# Patient Record
Sex: Female | Born: 1960
Health system: Southern US, Community
[De-identification: ages and names within clinical notes are randomized; demographics above are authoritative.]

## PROBLEM LIST (undated history)

## (undated) DIAGNOSIS — F410 Panic disorder [episodic paroxysmal anxiety] without agoraphobia: Secondary | ICD-10-CM

## (undated) DIAGNOSIS — N939 Abnormal uterine and vaginal bleeding, unspecified: Secondary | ICD-10-CM

## (undated) DIAGNOSIS — E7800 Pure hypercholesterolemia, unspecified: Secondary | ICD-10-CM

## (undated) DIAGNOSIS — N3941 Urge incontinence: Secondary | ICD-10-CM

## (undated) DIAGNOSIS — I82409 Acute embolism and thrombosis of unspecified deep veins of unspecified lower extremity: Secondary | ICD-10-CM

## (undated) DIAGNOSIS — E049 Nontoxic goiter, unspecified: Secondary | ICD-10-CM

## (undated) DIAGNOSIS — I10 Essential (primary) hypertension: Secondary | ICD-10-CM

## (undated) DIAGNOSIS — I499 Cardiac arrhythmia, unspecified: Secondary | ICD-10-CM

## (undated) HISTORY — DX: Abnormal uterine and vaginal bleeding, unspecified: N93.9

## (undated) HISTORY — PX: LEG SURGERY: SHX1003

## (undated) HISTORY — DX: Essential (primary) hypertension: I10

## (undated) HISTORY — DX: Urge incontinence: N39.41

## (undated) HISTORY — DX: Panic disorder (episodic paroxysmal anxiety): F41.0

## (undated) HISTORY — DX: Pure hypercholesterolemia, unspecified: E78.00

## (undated) HISTORY — DX: Nontoxic goiter, unspecified: E04.9

## (undated) HISTORY — PX: EYE SURGERY: SHX253

---

## 2009-07-16 ENCOUNTER — Emergency Department: Payer: Self-pay | Admitting: Emergency Medicine

## 2010-01-16 ENCOUNTER — Ambulatory Visit: Payer: Self-pay | Admitting: Family Medicine

## 2010-12-25 ENCOUNTER — Ambulatory Visit: Payer: Self-pay

## 2011-11-12 ENCOUNTER — Emergency Department: Payer: Self-pay | Admitting: Emergency Medicine

## 2012-01-09 ENCOUNTER — Ambulatory Visit: Payer: Self-pay

## 2012-01-20 ENCOUNTER — Ambulatory Visit: Payer: Self-pay

## 2013-02-08 ENCOUNTER — Ambulatory Visit: Payer: Self-pay | Admitting: Gastroenterology

## 2014-03-16 ENCOUNTER — Ambulatory Visit: Payer: Self-pay

## 2016-04-28 ENCOUNTER — Emergency Department (HOSPITAL_COMMUNITY): Payer: Commercial Managed Care - PPO

## 2016-04-28 ENCOUNTER — Encounter (HOSPITAL_COMMUNITY): Payer: Self-pay | Admitting: Nurse Practitioner

## 2016-04-28 ENCOUNTER — Emergency Department (HOSPITAL_COMMUNITY)
Admission: EM | Admit: 2016-04-28 | Discharge: 2016-04-28 | Disposition: A | Discharge status: Home / Self Care | Payer: Commercial Managed Care - PPO | Attending: Emergency Medicine | Admitting: Emergency Medicine

## 2016-04-28 DIAGNOSIS — W1842XA Slipping, tripping and stumbling without falling due to stepping into hole or opening, initial encounter: Secondary | ICD-10-CM | POA: Diagnosis not present

## 2016-04-28 DIAGNOSIS — Z791 Long term (current) use of non-steroidal anti-inflammatories (NSAID): Secondary | ICD-10-CM | POA: Insufficient documentation

## 2016-04-28 DIAGNOSIS — Y939 Activity, unspecified: Secondary | ICD-10-CM | POA: Diagnosis not present

## 2016-04-28 DIAGNOSIS — S99912A Unspecified injury of left ankle, initial encounter: Secondary | ICD-10-CM | POA: Diagnosis present

## 2016-04-28 DIAGNOSIS — Z79899 Other long term (current) drug therapy: Secondary | ICD-10-CM | POA: Diagnosis not present

## 2016-04-28 DIAGNOSIS — S92902A Unspecified fracture of left foot, initial encounter for closed fracture: Secondary | ICD-10-CM | POA: Diagnosis not present

## 2016-04-28 DIAGNOSIS — Y999 Unspecified external cause status: Secondary | ICD-10-CM | POA: Diagnosis not present

## 2016-04-28 DIAGNOSIS — Y929 Unspecified place or not applicable: Secondary | ICD-10-CM | POA: Diagnosis not present

## 2016-04-28 HISTORY — DX: Cardiac arrhythmia, unspecified: I49.9

## 2016-04-28 HISTORY — DX: Acute embolism and thrombosis of unspecified deep veins of unspecified lower extremity: I82.409

## 2016-04-28 MED ORDER — NAPROXEN 250 MG PO TABS
500.0000 mg | ORAL_TABLET | Freq: Once | ORAL | Status: AC
  Administered 2016-04-28: 500 mg via ORAL
  Filled 2016-04-28: qty 2

## 2016-04-28 MED ORDER — NAPROXEN 500 MG PO TABS: 500.0000 mg | ORAL_TABLET | Freq: Two times a day (BID) | ORAL | Status: DC

## 2016-04-28 MED ORDER — CYCLOBENZAPRINE HCL 10 MG PO TABS
5.0000 mg | ORAL_TABLET | Freq: Once | ORAL | Status: AC
  Administered 2016-04-28: 5 mg via ORAL
  Filled 2016-04-28: qty 1

## 2016-04-28 MED ORDER — CYCLOBENZAPRINE HCL 10 MG PO TABS: 10.0000 mg | ORAL_TABLET | Freq: Two times a day (BID) | ORAL | Status: DC | PRN

## 2016-04-28 NOTE — ED Provider Notes (Signed)
CSN: [id number]     Arrival date & time 04/28/16  1218 History   First MD Initiated Contact with Patient 04/28/16 1346     Chief Complaint  Patient presents with  . Ankle Injury   HPI  Brenda Guzman is a 55 y.o. female presenting with left ankle pain after stepping into a hole just prior to arrival. She states her ankle "twisted" and she felt a "pop." She describes her pain as constant, worse with weightbearing or movement, sharp, 6/10 pain scale, midfoot and lateral left ankle. She denies fevers, chills, chest pain, shortness of breath, abdominal pain, nausea, vomiting, change in bowel or bladder habits, injury elsewhere.  Past Medical History  Diagnosis Date  . Irregular heart beat   . DVT (deep venous thrombosis) [facility])    Past Surgical History  Procedure Laterality Date  . Cesarean section     History reviewed. No pertinent family history. Social History  Substance Use Topics  . Smoking status: Never Smoker   . Smokeless tobacco: None  . Alcohol Use: No   OB History    No data available     Review of Systems  Ten systems are reviewed and are negative for acute change except as noted in the HPI  Allergies  Review of patient's allergies indicates no known allergies.  Home Medications   Prior to Admission medications   Medication Sig Start Date End Date Taking? Authorizing Provider  cyclobenzaprine (FLEXERIL) 10 MG tablet Take 1 tablet (10 mg total) by mouth 2 (two) times daily as needed for muscle spasms. 04/28/16   [place] Lions, PA-C  naproxen (NAPROSYN) 500 MG tablet Take 1 tablet (500 mg total) by mouth 2 (two) times daily. 04/28/16   [place] Lions, PA-C   BP 128/95 mmHg  Pulse 63  Temp(Src) 97.8 F (36.6 C) (Oral)  Resp 16  SpO2 100% Physical Exam  Constitutional: She appears well-developed and well-nourished. No distress.  HENT:  Head: Normocephalic and atraumatic.  Eyes: Conjunctivae are normal. Right eye exhibits no discharge. Left eye exhibits  no discharge. No scleral icterus.  Neck: No tracheal deviation present.  Cardiovascular: Normal rate and intact distal pulses.   Pulmonary/Chest: Effort normal. No respiratory distress.  Abdominal: Soft. Bowel sounds are normal. She exhibits no distension and no mass. There is no tenderness. There is no rebound and no guarding.  Musculoskeletal: Normal range of motion. She exhibits edema and tenderness.  Tenderness at lateral malleolus and superior midfoot. Mild soft tissue edema at lateral foot. No discolorations.  Lymphadenopathy:    She has no cervical adenopathy.  Neurological: She is alert. Coordination normal.  Neurovascularly intact bilaterally  Skin: Skin is warm and dry. No rash noted. She is not diaphoretic. No erythema.  Psychiatric: She has a normal mood and affect. Her behavior is normal.  Nursing note and vitals reviewed.   ED Course  Procedures  Imaging Review Dg Ankle Complete Left  04/28/2016  CLINICAL DATA:  Stepped in hole twisting left ankle. EXAM: LEFT ANKLE COMPLETE - 3+ VIEW COMPARISON:  None. FINDINGS: There is a small fracture arising from the lateral aspect of the hindfoot. Presumably this is arising from the calcaneus. Mild overlying soft tissue swelling identified. The remaining osseous structures appear intact. IMPRESSION: 1. Small fracture arising from the lateral midfoot is presumably of calcaneal origin. Correlation with exact sided tenderness is advise. 2. Soft tissue swelling. Electronically Signed   By: Kerby Moors M.D.   On: 04/28/2016 13:27   I  have personally reviewed and evaluated these images and lab results as part of my medical decision-making.  MDM   Final diagnoses:  Ankle injury, left, initial encounter   X-ray demonstrates small fracture arising from lateral midfoot, presumably of calcaneal origin. Patient is not tender at calcaneus. Bone fragment most likely incidental finding. Will wrap patient's ankle today, give crutches, send home with  symptomatic treatment. Patient to follow-up with orthopedics. Patient is in agreement and understanding with the plan. Discussed case with Dr. Tyrone Nine who agrees with above plan.  [place] Lions, PA-C 04/28/16 [place], DO 04/28/16 1806

## 2016-04-28 NOTE — ED Notes (Signed)
She c/o L ankle pain onset since she walked into a hole just PTA. She twisted the ankle and felt a pop. Cms intaact

## 2016-04-28 NOTE — Discharge Instructions (Signed)
Brenda Guzman,  Nice meeting you! Please follow-up with Dr. Sharol Given (orthopedics). Return to the emergency department if you develop increased pain, discolorations, numbness/tingling, new/worsening symptoms. Feel better soon!  S. Wendie Simmer, PA-C

## 2016-08-08 ENCOUNTER — Other Ambulatory Visit: Payer: Self-pay | Admitting: Obstetrics and Gynecology

## 2016-08-08 DIAGNOSIS — Z1231 Encounter for screening mammogram for malignant neoplasm of breast: Secondary | ICD-10-CM

## 2016-08-28 ENCOUNTER — Ambulatory Visit
Admission: RE | Admit: 2016-08-28 | Discharge: 2016-08-28 | Disposition: A | Discharge status: Home / Self Care | Payer: Commercial Managed Care - PPO | Source: Ambulatory Visit | Attending: Obstetrics and Gynecology | Admitting: Obstetrics and Gynecology

## 2016-08-28 DIAGNOSIS — Z1231 Encounter for screening mammogram for malignant neoplasm of breast: Secondary | ICD-10-CM | POA: Diagnosis present

## 2016-09-04 ENCOUNTER — Inpatient Hospital Stay
Admission: RE | Admit: 2016-09-04 | Discharge: 2016-09-04 | Disposition: A | Discharge status: Home / Self Care | Payer: Self-pay | Source: Ambulatory Visit | Attending: *Deleted | Admitting: *Deleted

## 2016-09-04 ENCOUNTER — Other Ambulatory Visit: Payer: Self-pay | Admitting: *Deleted

## 2016-09-04 DIAGNOSIS — Z9289 Personal history of other medical treatment: Secondary | ICD-10-CM

## 2017-05-13 DIAGNOSIS — H25013 Cortical age-related cataract, bilateral: Secondary | ICD-10-CM | POA: Diagnosis not present

## 2017-05-13 DIAGNOSIS — H1013 Acute atopic conjunctivitis, bilateral: Secondary | ICD-10-CM | POA: Diagnosis not present

## 2017-05-13 DIAGNOSIS — H40013 Open angle with borderline findings, low risk, bilateral: Secondary | ICD-10-CM | POA: Diagnosis not present

## 2017-05-13 DIAGNOSIS — H524 Presbyopia: Secondary | ICD-10-CM | POA: Diagnosis not present

## 2017-05-20 DIAGNOSIS — D2262 Melanocytic nevi of left upper limb, including shoulder: Secondary | ICD-10-CM | POA: Diagnosis not present

## 2017-05-20 DIAGNOSIS — D225 Melanocytic nevi of trunk: Secondary | ICD-10-CM | POA: Diagnosis not present

## 2017-05-20 DIAGNOSIS — D2272 Melanocytic nevi of left lower limb, including hip: Secondary | ICD-10-CM | POA: Diagnosis not present

## 2017-07-23 ENCOUNTER — Ambulatory Visit (INDEPENDENT_AMBULATORY_CARE_PROVIDER_SITE_OTHER): Payer: Commercial Managed Care - PPO | Admitting: Obstetrics and Gynecology

## 2017-07-23 ENCOUNTER — Encounter: Payer: Self-pay | Admitting: Obstetrics and Gynecology

## 2017-07-23 VITALS — BP 122/74 | Ht 62.0 in | Wt 131.0 lb

## 2017-07-23 DIAGNOSIS — Z1231 Encounter for screening mammogram for malignant neoplasm of breast: Secondary | ICD-10-CM

## 2017-07-23 DIAGNOSIS — Z803 Family history of malignant neoplasm of breast: Secondary | ICD-10-CM | POA: Diagnosis not present

## 2017-07-23 DIAGNOSIS — Z124 Encounter for screening for malignant neoplasm of cervix: Secondary | ICD-10-CM

## 2017-07-23 DIAGNOSIS — Z1151 Encounter for screening for human papillomavirus (HPV): Secondary | ICD-10-CM

## 2017-07-23 DIAGNOSIS — E049 Nontoxic goiter, unspecified: Secondary | ICD-10-CM | POA: Diagnosis not present

## 2017-07-23 DIAGNOSIS — Z01419 Encounter for gynecological examination (general) (routine) without abnormal findings: Secondary | ICD-10-CM | POA: Diagnosis not present

## 2017-07-23 DIAGNOSIS — Z1239 Encounter for other screening for malignant neoplasm of breast: Secondary | ICD-10-CM

## 2017-07-23 NOTE — Progress Notes (Signed)
PCP: [place] Pink, MD   Chief Complaint  Patient presents with  . Annual Exam    HPI:      Brenda Guzman is a 56 y.o. No obstetric history on file. who LMP was No LMP recorded. Patient is not currently having periods (Reason: Perimenopausal)., presents today for her annual examination.  Her menses are absent due to menopause. She does not have intermenstrual bleeding. She does not have vasomotor sx.   Sex activity: single partner, contraception - post menopausal status. She does have vaginal dryness, improved with lubricants.  Last Pap: March 07, 2014  Results were: no abnormalities /neg HPV DNA.  Hx of STDs: none  Last mammogram: August 28, 2016  Results were: normal--routine follow-up in 12 months There is a FH of breast cancer in her mat aunt and pat cousin, genetic testing not indicated. There is no FH of ovarian cancer. The patient does do self-breast exams.  Colonoscopy: colonoscopy 4 years ago without abnormalities. . Repeat due after 5 years due to [place].  Tobacco use: The patient denies current or previous tobacco use. Alcohol use: none Exercise: moderately active  She does not get adequate calcium and Vitamin D in her diet.  Since her last annual GYN exam, she has not had any significant changes in her health history. Pt with hx of enlarged thyroid with normal thyroid labs the past 3 yrs and stable u/s (2015). She is due for labs this year.   Labs with PCP.    Past Medical History:  Diagnosis Date  . Abnormal uterine bleeding   . DVT (deep venous thrombosis) ([place])   . Enlarged thyroid   . Hypercholesterolemia   . Hypertension   . Irregular heart beat   . Panic disorder   . Urge incontinence     Past Surgical History:  Procedure Laterality Date  . CESAREAN SECTION    . EYE SURGERY    . LEG SURGERY      Family History  Problem Relation Age of Onset  . Breast cancer Maternal Aunt 70  . Breast cancer Cousin 46  . Alzheimer's disease Father   .  Hypertension Father   . Pancreatic cancer Maternal Grandmother   . Colon cancer Paternal Grandmother     Social History   Social History  . Marital status: Married    Spouse name: N/A  . Number of children: N/A  . Years of education: N/A   Occupational History  . Not on file.   Social History Main Topics  . Smoking status: Never Smoker  . Smokeless tobacco: Never Used  . Alcohol use No  . Drug use: No  . Sexual activity: Yes   Other Topics Concern  . Not on file   Social History Narrative  . No narrative on file    No outpatient prescriptions have been marked as taking for the 07/23/17 encounter (Office Visit) with Copland, Deirdre Evener, PA-C.      ROS:  Review of Systems  Constitutional: Negative for fatigue, fever and unexpected weight change.  Respiratory: Negative for cough, shortness of breath and wheezing.   Cardiovascular: Negative for chest pain, palpitations and leg swelling.  Gastrointestinal: Negative for blood in stool, constipation, diarrhea, nausea and vomiting.  Endocrine: Negative for cold intolerance, heat intolerance and polyuria.  Genitourinary: Negative for dyspareunia, dysuria, flank pain, frequency, genital sores, hematuria, menstrual problem, pelvic pain, urgency, vaginal bleeding, vaginal discharge and vaginal pain.  Musculoskeletal: Negative for back pain, joint swelling and myalgias.  Skin: Negative for rash.  Neurological: Negative for dizziness, syncope, light-headedness, numbness and headaches.  Hematological: Negative for adenopathy.  Psychiatric/Behavioral: Negative for agitation, confusion, sleep disturbance and suicidal ideas. The patient is not nervous/anxious.      Objective: BP 122/74   Ht 5\' 2"  (1.575 m)   Wt 131 lb (59.4 kg)   BMI 23.96 kg/m    Physical Exam  Constitutional: She is oriented to person, place, and time. She appears well-developed and well-nourished.  Genitourinary: Vagina normal and uterus normal. There is  no rash or tenderness on the right labia. There is no rash or tenderness on the left labia. No erythema or tenderness in the vagina. No vaginal discharge found. Right adnexum does not display mass and does not display tenderness. Left adnexum does not display mass and does not display tenderness. Cervix does not exhibit motion tenderness or polyp. Uterus is not enlarged or tender.  Neck: Normal range of motion. Thyromegaly present.  Cardiovascular: Normal rate, regular rhythm and normal heart sounds.   No murmur heard. Pulmonary/Chest: Effort normal and breath sounds normal. Right breast exhibits no mass, no nipple discharge, no skin change and no tenderness. Left breast exhibits no mass, no nipple discharge, no skin change and no tenderness.  Abdominal: Soft. There is no tenderness. There is no guarding.  Musculoskeletal: Normal range of motion.  Neurological: She is alert and oriented to person, place, and time. No cranial nerve deficit.  Psychiatric: She has a normal mood and affect. Her behavior is normal.  Vitals reviewed.    Assessment/Plan:  Encounter for annual routine gynecological examination  Cervical cancer screening - Plan: IGP, Aptima HPV  Screening for HPV (human papillomavirus) - Plan: IGP, Aptima HPV  Screening for breast cancer - Pt to sched mammo. - Plan: MM SCREENING BREAST TOMO BILATERAL  Family history of breast cancer - Plan: MM SCREENING BREAST TOMO BILATERAL  Enlarged thyroid - Check labs. If abn, will check u/s.  - Plan: TSH + free T4          GYN counsel mammography screening, adequate intake of calcium and vitamin D    F/U  Return in about 1 year (around 07/23/2018).  Alicia B. Copland, PA-C 07/23/2017 8:56 AM

## 2017-07-24 LAB — TSH+FREE T4
FREE T4: 1.19 ng/dL (ref 0.82–1.77)
TSH: 2.2 u[IU]/mL (ref 0.450–4.500)

## 2017-07-26 LAB — IGP, APTIMA HPV
HPV APTIMA: NEGATIVE
PAP Smear Comment: 0

## 2017-09-03 DIAGNOSIS — Z13220 Encounter for screening for lipoid disorders: Secondary | ICD-10-CM | POA: Diagnosis not present

## 2017-09-03 DIAGNOSIS — Z131 Encounter for screening for diabetes mellitus: Secondary | ICD-10-CM | POA: Diagnosis not present

## 2017-09-03 DIAGNOSIS — Z79899 Other long term (current) drug therapy: Secondary | ICD-10-CM | POA: Diagnosis not present

## 2017-09-10 DIAGNOSIS — Z0000 Encounter for general adult medical examination without abnormal findings: Secondary | ICD-10-CM | POA: Diagnosis not present

## 2017-09-10 DIAGNOSIS — Z23 Encounter for immunization: Secondary | ICD-10-CM | POA: Diagnosis not present

## 2017-09-10 DIAGNOSIS — E782 Mixed hyperlipidemia: Secondary | ICD-10-CM | POA: Diagnosis not present

## 2017-09-10 DIAGNOSIS — R002 Palpitations: Secondary | ICD-10-CM | POA: Diagnosis not present

## 2017-10-13 DIAGNOSIS — R002 Palpitations: Secondary | ICD-10-CM | POA: Diagnosis not present

## 2017-10-13 DIAGNOSIS — E782 Mixed hyperlipidemia: Secondary | ICD-10-CM | POA: Diagnosis not present

## 2017-10-13 DIAGNOSIS — Z8679 Personal history of other diseases of the circulatory system: Secondary | ICD-10-CM | POA: Diagnosis not present

## 2017-10-15 IMAGING — MG MM DIGITAL SCREENING BILAT W/ TOMO W/ CAD
9 of 12 series · 9 of 28 positions shown · non-contrast
Comparison: Previous exam(s).

CLINICAL DATA: Screening.

EXAM:
2D DIGITAL SCREENING BILATERAL MAMMOGRAM WITH CAD AND ADJUNCT TOMO

[R MLO]
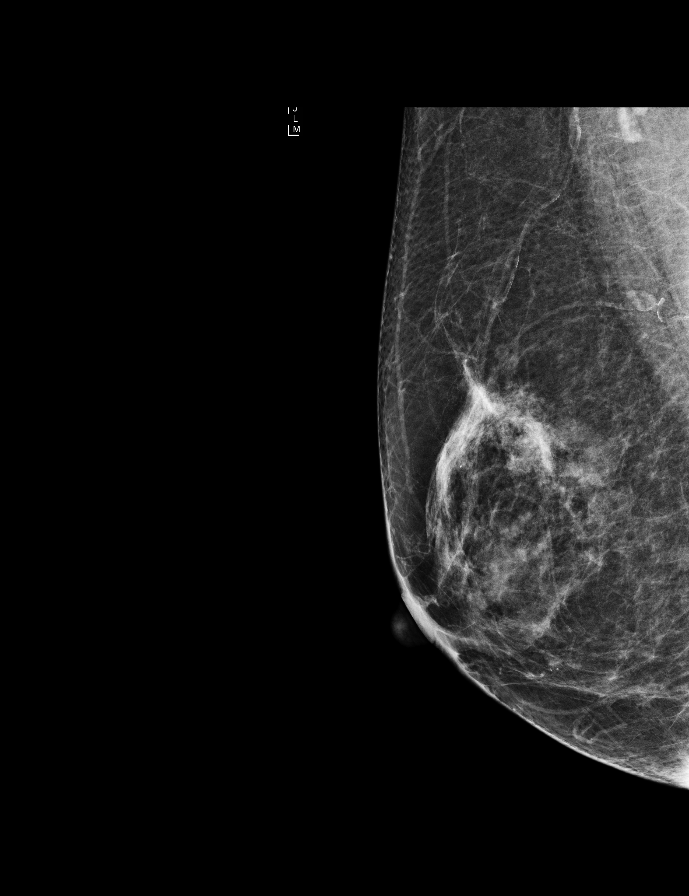

[R CC synth-2D]
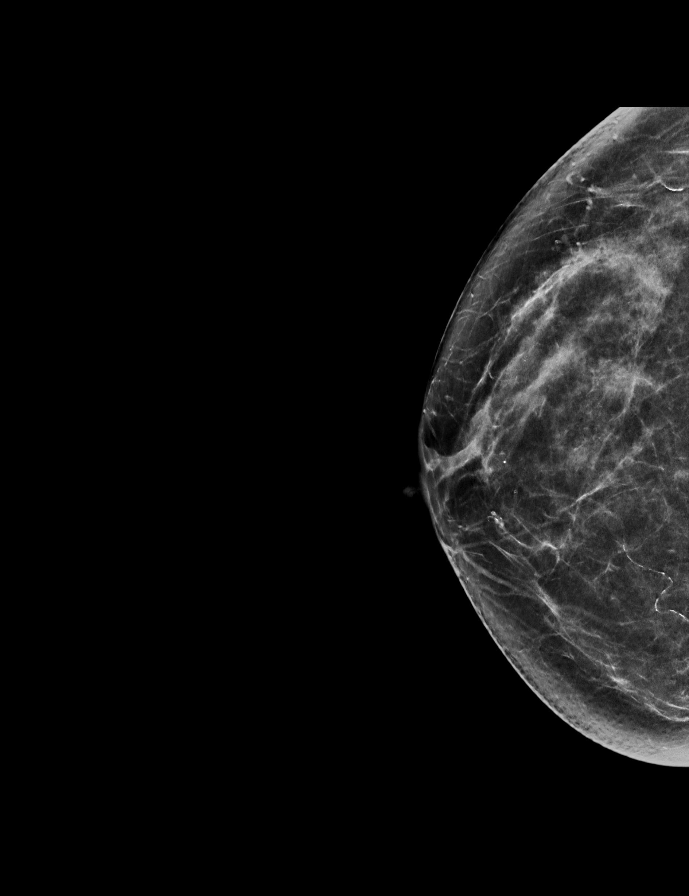

[R MLO synth-2D]
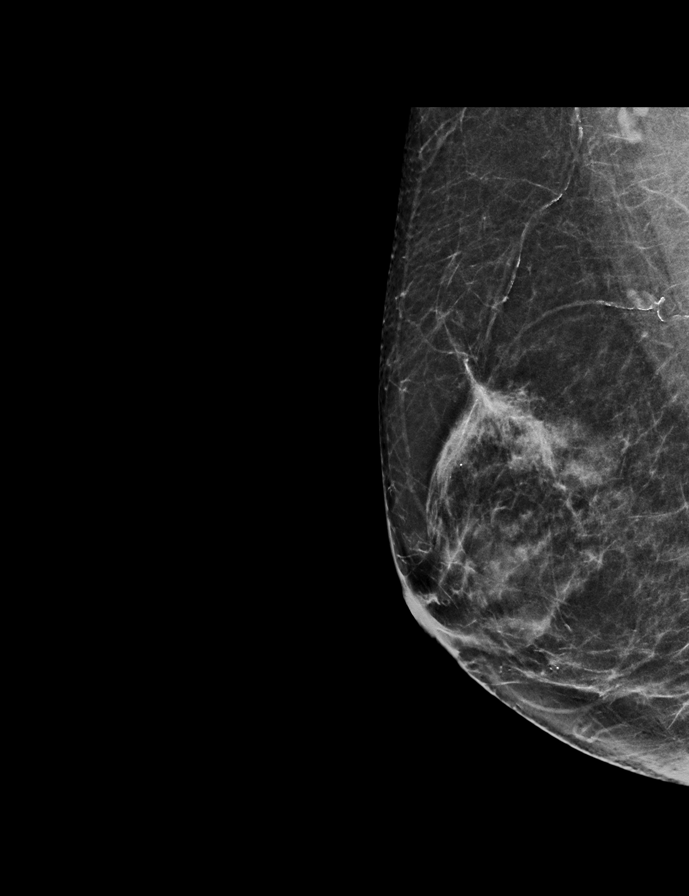

[L MLO synth-2D]
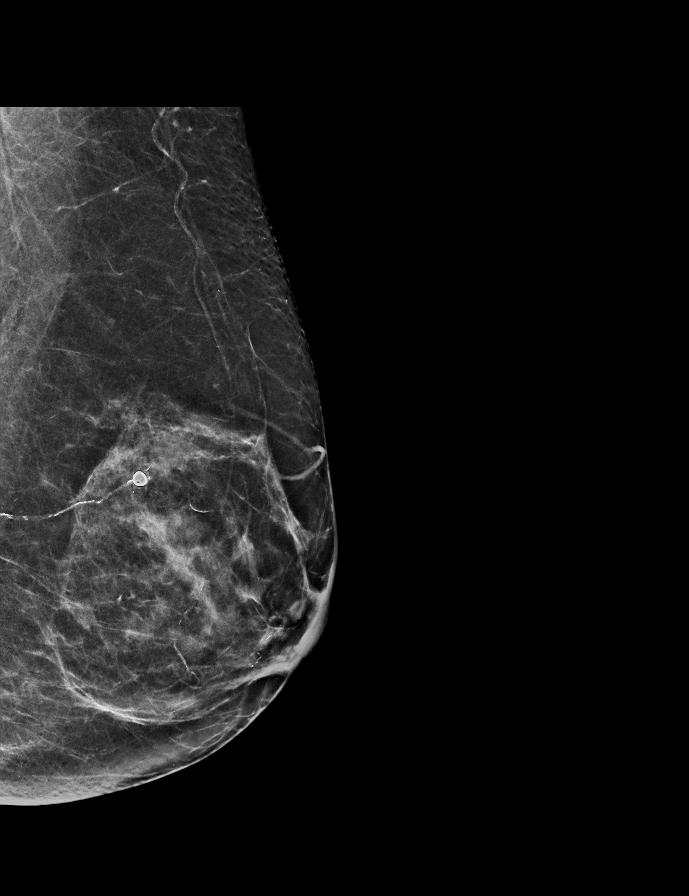

[L MLO]
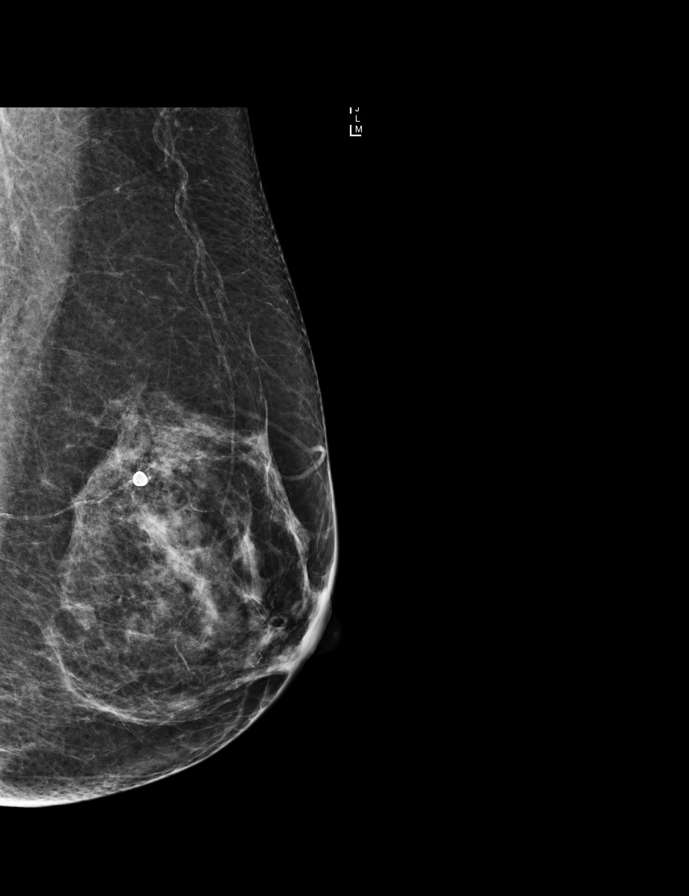

[R CC]
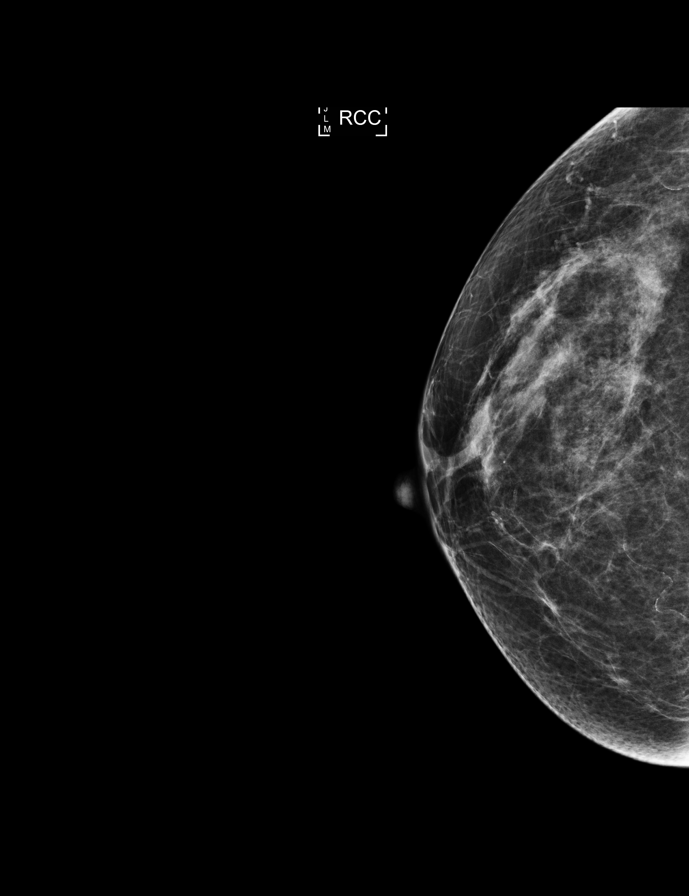

[L CC synth-2D]
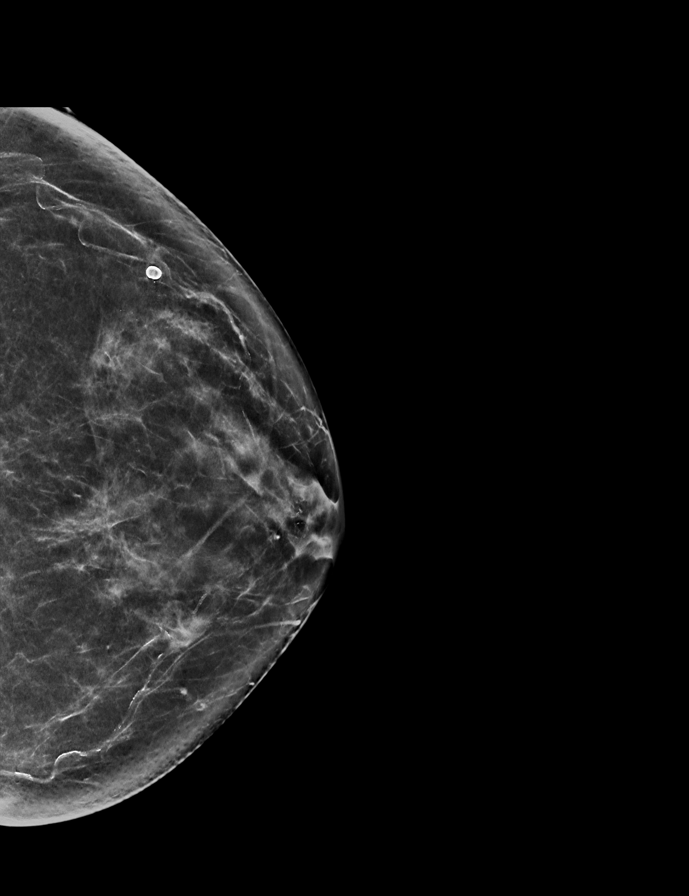

[L CC]
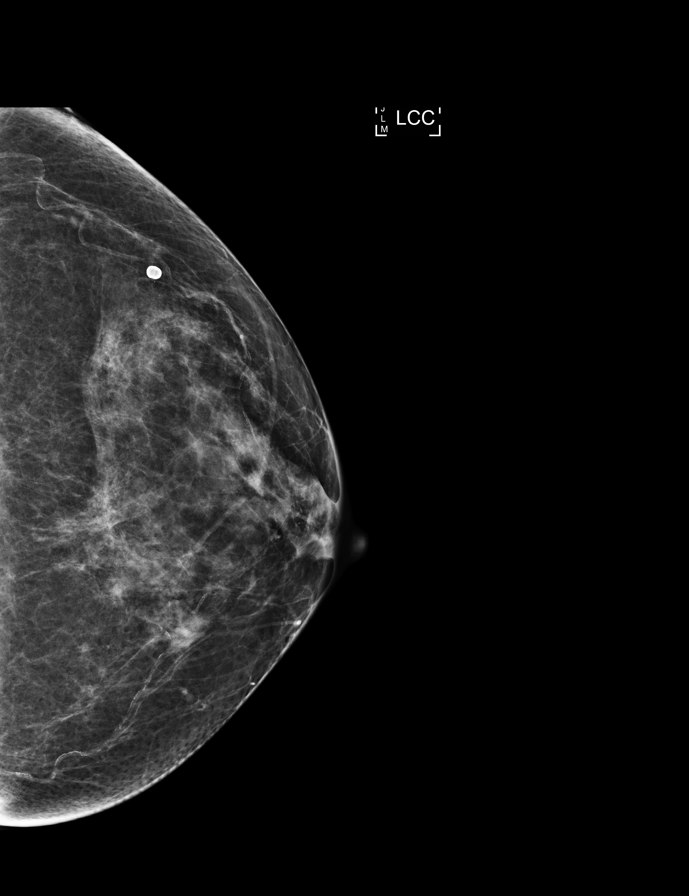

[R CC tomo · tomo slice 35/69.0]
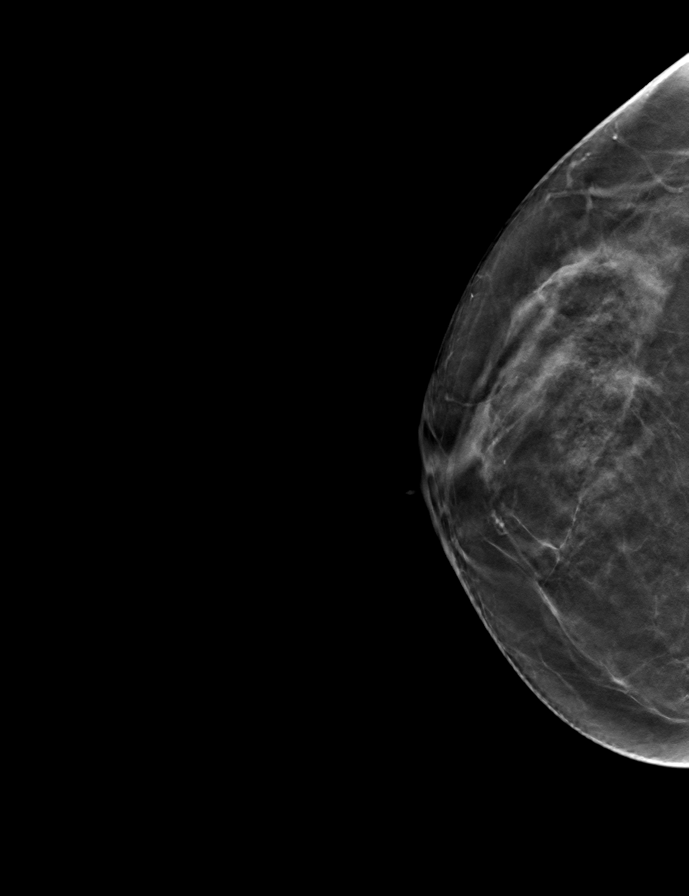

[9 of 28 positions shown; findings below may reference images not displayed]

ACR Breast Density Category b: There are scattered areas of
fibroglandular density.
FINDINGS: There are no findings suspicious for malignancy. Images were
processed with CAD.
IMPRESSION: No mammographic evidence of malignancy. A result letter of this
screening mammogram will be mailed directly to the patient.

RECOMMENDATION:
Screening mammogram in one year. (Code:97-6-RS4)

BI-RADS CATEGORY  1: Negative.

## 2017-11-10 DIAGNOSIS — J0100 Acute maxillary sinusitis, unspecified: Secondary | ICD-10-CM | POA: Diagnosis not present

## 2017-11-17 ENCOUNTER — Encounter (HOSPITAL_COMMUNITY): Payer: Self-pay

## 2017-11-17 ENCOUNTER — Emergency Department (HOSPITAL_COMMUNITY)
Admission: EM | Admit: 2017-11-17 | Discharge: 2017-11-17 | Disposition: A | Discharge status: Home / Self Care | Payer: Commercial Managed Care - PPO | Attending: Emergency Medicine | Admitting: Emergency Medicine

## 2017-11-17 ENCOUNTER — Other Ambulatory Visit: Payer: Self-pay

## 2017-11-17 DIAGNOSIS — R55 Syncope and collapse: Secondary | ICD-10-CM | POA: Diagnosis not present

## 2017-11-17 DIAGNOSIS — I10 Essential (primary) hypertension: Secondary | ICD-10-CM | POA: Insufficient documentation

## 2017-11-17 DIAGNOSIS — R519 Headache, unspecified: Secondary | ICD-10-CM

## 2017-11-17 DIAGNOSIS — E860 Dehydration: Secondary | ICD-10-CM

## 2017-11-17 DIAGNOSIS — Z79899 Other long term (current) drug therapy: Secondary | ICD-10-CM | POA: Insufficient documentation

## 2017-11-17 DIAGNOSIS — Z7982 Long term (current) use of aspirin: Secondary | ICD-10-CM | POA: Diagnosis not present

## 2017-11-17 DIAGNOSIS — R197 Diarrhea, unspecified: Secondary | ICD-10-CM | POA: Diagnosis not present

## 2017-11-17 DIAGNOSIS — R001 Bradycardia, unspecified: Secondary | ICD-10-CM | POA: Diagnosis not present

## 2017-11-17 DIAGNOSIS — R51 Headache: Secondary | ICD-10-CM | POA: Diagnosis not present

## 2017-11-17 DIAGNOSIS — I499 Cardiac arrhythmia, unspecified: Secondary | ICD-10-CM | POA: Diagnosis not present

## 2017-11-17 LAB — CBC WITH DIFFERENTIAL/PLATELET
Basophils Absolute: 0 10*3/uL (ref 0.0–0.1)
Basophils Relative: 0 %
EOS PCT: 0 %
Eosinophils Absolute: 0 10*3/uL (ref 0.0–0.7)
HEMATOCRIT: 41.3 % (ref 36.0–46.0)
Hemoglobin: 14.1 g/dL (ref 12.0–15.0)
LYMPHS PCT: 10 %
Lymphs Abs: 1.6 10*3/uL (ref 0.7–4.0)
MCH: 30.5 pg (ref 26.0–34.0)
MCHC: 34.1 g/dL (ref 30.0–36.0)
MCV: 89.4 fL (ref 78.0–100.0)
MONO ABS: 0.9 10*3/uL (ref 0.1–1.0)
Monocytes Relative: 6 %
NEUTROS ABS: 13.5 10*3/uL — AB (ref 1.7–7.7)
Neutrophils Relative %: 84 %
PLATELETS: 289 10*3/uL (ref 150–400)
RBC: 4.62 MIL/uL (ref 3.87–5.11)
RDW: 12.7 % (ref 11.5–15.5)
WBC: 16 10*3/uL — ABNORMAL HIGH (ref 4.0–10.5)

## 2017-11-17 LAB — COMPREHENSIVE METABOLIC PANEL
ALK PHOS: 102 U/L (ref 38–126)
ALT: 38 U/L (ref 14–54)
ANION GAP: 9 (ref 5–15)
AST: 34 U/L (ref 15–41)
Albumin: 4.3 g/dL (ref 3.5–5.0)
BILIRUBIN TOTAL: 0.9 mg/dL (ref 0.3–1.2)
BUN: 22 mg/dL — AB (ref 6–20)
CALCIUM: 9.5 mg/dL (ref 8.9–10.3)
CO2: 25 mmol/L (ref 22–32)
Chloride: 102 mmol/L (ref 101–111)
Creatinine, Ser: 0.78 mg/dL (ref 0.44–1.00)
GFR calc Af Amer: >60 mL/min (ref >60)
GFR calc non Af Amer: >60 mL/min (ref >60)
GLUCOSE: 115 mg/dL — AB (ref 65–99)
POTASSIUM: 3.7 mmol/L (ref 3.5–5.1)
SODIUM: 136 mmol/L (ref 135–145)
Total Protein: 7.9 g/dL (ref 6.5–8.1)

## 2017-11-17 LAB — CBG MONITORING, ED: Glucose-Capillary: 102 mg/dL — ABNORMAL HIGH (ref 65–99)

## 2017-11-17 MED ORDER — KETOROLAC TROMETHAMINE 15 MG/ML IJ SOLN
15.0000 mg | Freq: Once | INTRAMUSCULAR | Status: AC
  Administered 2017-11-17: 15 mg via INTRAVENOUS
  Filled 2017-11-17: qty 1

## 2017-11-17 MED ORDER — SODIUM CHLORIDE 0.9 % IV BOLUS (SEPSIS)
1000.0000 mL | Freq: Once | INTRAVENOUS | Status: AC
  Administered 2017-11-17: 1000 mL via INTRAVENOUS

## 2017-11-17 MED ORDER — METOCLOPRAMIDE HCL 5 MG/ML IJ SOLN
10.0000 mg | Freq: Once | INTRAMUSCULAR | Status: AC
  Administered 2017-11-17: 10 mg via INTRAVENOUS
  Filled 2017-11-17: qty 2

## 2017-11-17 MED ORDER — DIPHENHYDRAMINE HCL 50 MG/ML IJ SOLN
12.5000 mg | Freq: Once | INTRAMUSCULAR | Status: AC
Start: 2017-11-17 — End: 2017-11-17
  Administered 2017-11-17: 12.5 mg via INTRAVENOUS
  Filled 2017-11-17: qty 1

## 2017-11-17 NOTE — ED Provider Notes (Signed)
[facility] DEPT Provider Note   CSN: [id number] Arrival date & time: 11/17/17  8786     History   Chief Complaint Chief Complaint  Patient presents with  . Diarrhea    HPI Brenda Guzman is a 56 y.o. female.  The history is provided by the patient. No language interpreter was used.  Diarrhea      Brenda Guzman is a 56 y.o. female who presents to the Emergency Department complaining of HA, diarrhea.  She reports several weeks ago feeling like she had a sinus infection with nasal congestion and facial pressure.  She was called in a Z-Pak and took that but had ongoing symptoms.  She then saw her primary care provider and was started on prednisone for a sinus infection.  She reports bitemporal headache that is been present for the last 10 days.  It is constant and moderate in nature.  Yesterday she started taking Sudafed and did have mild improvement in her headache but it recurred today.  No objective fevers.  No nausea, vomiting.  This morning she developed diarrhea with 3 pale colored stools.  During the episode she felt hot and not good as if she might pass out.  Overall currently she complains of headache and feeling unwell and profoundly weak in general.  Past Medical History:  Diagnosis Date  . Abnormal uterine bleeding   . DVT (deep venous thrombosis) ([place])   . Enlarged thyroid   . Hypercholesterolemia   . Hypertension   . Irregular heart beat   . Panic disorder   . Urge incontinence     Patient Active Problem List   Diagnosis Date Noted  . Enlarged thyroid 07/23/2017    Past Surgical History:  Procedure Laterality Date  . CESAREAN SECTION    . EYE SURGERY    . LEG SURGERY      OB History    Gravida Para Term Preterm AB Living   3 1 1   2 1    SAB TAB Ectopic Multiple Live Births   2       1       Home Medications    Prior to Admission medications   Medication Sig Start Date End Date Taking? Authorizing Provider    acetaminophen (TYLENOL) 500 MG tablet Take 1,000 mg by mouth every 6 (six) hours as needed for mild pain.   Yes [provider]  ALPRAZolam (XANAX) 1 MG tablet TAKE 1/2 TO 1 TABLET BY MOUTH NIGHTLY AS NEEDED FOR SLEEP 08/19/16  Yes [provider]  aspirin EC 81 MG tablet Take 81 mg by mouth daily.   Yes [provider]  atorvastatin (LIPITOR) 20 MG tablet Take 10 mg by mouth daily. 09/10/17  Yes [provider]  azelastine (ASTELIN) 0.1 % nasal spray PLACE 1 SPRAY INTO BOTH NOSTRILS 2 (TWO) TIMES DAILY 10/21/17  Yes [provider]  cetirizine (ZYRTEC) 10 MG tablet Take 10 mg by mouth daily. 11/10/17  Yes [provider]  metoprolol succinate (TOPROL-XL) 25 MG 24 hr tablet Take 25 mg by mouth daily. 09/08/17  Yes [provider]  predniSONE (DELTASONE) 10 MG tablet Take 10 mg by mouth daily. 11/10/17  Yes [provider]  pseudoephedrine (SUDAFED) 60 MG tablet Take 60 mg by mouth every 4 (four) hours as needed for congestion.   Yes [provider]  cyclobenzaprine (FLEXERIL) 10 MG tablet Take 1 tablet (10 mg total) by mouth 2 (two) times daily as needed for  muscle spasms. Patient not taking: Reported on 11/17/2017 04/28/16   [place] Lions, PA-C  naproxen (NAPROSYN) 500 MG tablet Take 1 tablet (500 mg total) by mouth 2 (two) times daily. Patient not taking: Reported on 11/17/2017 04/28/16   [place] Lions, PA-C    Family History Family History  Problem Relation Age of Onset  . Breast cancer Maternal Aunt 70  . Breast cancer Cousin 55  . Alzheimer's disease Father   . Hypertension Father   . Pancreatic cancer Maternal Grandmother   . Colon cancer Paternal Grandmother     Social History Social History   Tobacco Use  . Smoking status: Never Smoker  . Smokeless tobacco: Never Used  Substance Use Topics  . Alcohol use: No  . Drug use: No     Allergies   Patient has no known  allergies.   Review of Systems Review of Systems  Gastrointestinal: Positive for diarrhea.  All other systems reviewed and are negative.    Physical Exam Updated Vital Signs BP 112/76 (BP Location: Right Arm)   Pulse 62   Temp 97.6 F (36.4 C) (Oral)   Resp 14   SpO2 100%   Physical Exam  Constitutional: She is oriented to person, place, and time. She appears well-developed and well-nourished.  HENT:  Head: Normocephalic and atraumatic.  Right Ear: External ear normal.  Left Ear: External ear normal.  Eyes: Conjunctivae and EOM are normal. Pupils are equal, round, and reactive to light.  Cardiovascular: Normal rate and regular rhythm.  No murmur heard. Pulmonary/Chest: Effort normal and breath sounds normal. No respiratory distress.  Abdominal: Soft. There is no tenderness. There is no rebound and no guarding.  Musculoskeletal: She exhibits no edema or tenderness.  Neurological: She is alert and oriented to person, place, and time. No cranial nerve deficit.  5 out of 5 strength in all 4 extremities with sensation to light touch intact in all 4 extremities  Skin: Skin is warm and dry.  Psychiatric: She has a normal mood and affect. Her behavior is normal.  Nursing note and vitals reviewed.    ED Treatments / Results  Labs (all labs ordered are listed, but only abnormal results are displayed) Labs Reviewed  COMPREHENSIVE METABOLIC PANEL - Abnormal; Notable for the following components:      Result Value   Glucose, Bld 115 (*)    BUN 22 (*)    All other components within normal limits  CBC WITH DIFFERENTIAL/PLATELET - Abnormal; Notable for the following components:   WBC 16.0 (*)    Neutro Abs 13.5 (*)    All other components within normal limits  CBG MONITORING, ED - Abnormal; Notable for the following components:   Glucose-Capillary 102 (*)    All other components within normal limits    EKG  EKG Interpretation  Date/Time:  Monday November 17 2017 09:00:18  EST Ventricular Rate:  57 PR Interval:    QRS Duration: 95 QT Interval:  424 QTC Calculation: 413 R Axis:   73 Text Interpretation:  Sinus rhythm Confirmed by Quintella Reichert [phone]) on 11/17/2017 11:34:06 AM       Radiology No results found.  Procedures Procedures (including critical care time)  Medications Ordered in ED Medications  sodium chloride 0.9 % bolus 1,000 mL (0 mLs Intravenous Stopped 11/17/17 1204)  metoCLOPramide (REGLAN) injection 10 mg (10 mg Intravenous Given 11/17/17 1043)  diphenhydrAMINE (BENADRYL) injection 12.5 mg (12.5 mg Intravenous Given 11/17/17 1041)  ketorolac (TORADOL) 15 MG/ML injection  15 mg (15 mg Intravenous Given 11/17/17 1247)     Initial Impression / Assessment and Plan / ED Course  I have reviewed the triage vital signs and the nursing notes.  Pertinent labs & imaging results that were available during my care of the patient were reviewed by me and considered in my medical decision making (see chart for details).     Sheet here for evaluation of headache, diarrhea with near syncopal event today.  Patient is unable to provide stool sample in the emergency department.  In terms of near syncope, this is likely secondary to dehydration and vasovagal episode.  In terms of headache, presentation is not consistent with serious bacterial infection, meningitis, subarachnoid hemorrhage, dural sinus thrombosis.  CBC does have leukocytosis, this is likely secondary to prednisone use.  She is feeling improved on repeat evaluation.  Counseled patient on home care, outpatient follow-up and return precautions.  Final Clinical Impressions(s) / ED Diagnoses   Final diagnoses:  Diarrhea, unspecified type  Dehydration  Near syncope  Bad headache    ED Discharge Orders    None       Quintella Reichert, MD 11/17/17 [phone]

## 2017-11-17 NOTE — ED Triage Notes (Signed)
She states that after having had a diarrhea wtool this morning she felt "hot and not good". This feeling passed quickly and she is in no distress. She mentions that she has just completed antibiotic therapy for a "sinus infection".

## 2017-12-10 DIAGNOSIS — R0981 Nasal congestion: Secondary | ICD-10-CM | POA: Diagnosis not present

## 2017-12-10 DIAGNOSIS — H6983 Other specified disorders of Eustachian tube, bilateral: Secondary | ICD-10-CM | POA: Diagnosis not present

## 2017-12-10 DIAGNOSIS — R51 Headache: Secondary | ICD-10-CM | POA: Diagnosis not present

## 2017-12-11 DIAGNOSIS — H903 Sensorineural hearing loss, bilateral: Secondary | ICD-10-CM | POA: Diagnosis not present

## 2017-12-11 DIAGNOSIS — J019 Acute sinusitis, unspecified: Secondary | ICD-10-CM | POA: Diagnosis not present

## 2018-05-14 DIAGNOSIS — H25013 Cortical age-related cataract, bilateral: Secondary | ICD-10-CM | POA: Diagnosis not present

## 2018-05-14 DIAGNOSIS — H1013 Acute atopic conjunctivitis, bilateral: Secondary | ICD-10-CM | POA: Diagnosis not present

## 2018-05-14 DIAGNOSIS — H40013 Open angle with borderline findings, low risk, bilateral: Secondary | ICD-10-CM | POA: Diagnosis not present

## 2018-05-14 DIAGNOSIS — H524 Presbyopia: Secondary | ICD-10-CM | POA: Diagnosis not present

## 2018-05-19 ENCOUNTER — Other Ambulatory Visit: Payer: Self-pay | Admitting: Obstetrics and Gynecology

## 2018-05-19 DIAGNOSIS — D2262 Melanocytic nevi of left upper limb, including shoulder: Secondary | ICD-10-CM | POA: Diagnosis not present

## 2018-05-19 DIAGNOSIS — D225 Melanocytic nevi of trunk: Secondary | ICD-10-CM | POA: Diagnosis not present

## 2018-05-19 DIAGNOSIS — Z1231 Encounter for screening mammogram for malignant neoplasm of breast: Secondary | ICD-10-CM

## 2018-05-19 DIAGNOSIS — D2261 Melanocytic nevi of right upper limb, including shoulder: Secondary | ICD-10-CM | POA: Diagnosis not present

## 2018-06-16 ENCOUNTER — Ambulatory Visit
Admission: RE | Admit: 2018-06-16 | Discharge: 2018-06-16 | Disposition: A | Discharge status: Home / Self Care | Payer: Commercial Managed Care - PPO | Source: Ambulatory Visit | Attending: Obstetrics and Gynecology | Admitting: Obstetrics and Gynecology

## 2018-06-16 DIAGNOSIS — Z1231 Encounter for screening mammogram for malignant neoplasm of breast: Secondary | ICD-10-CM | POA: Insufficient documentation

## 2018-06-17 ENCOUNTER — Encounter: Payer: Self-pay | Admitting: Obstetrics and Gynecology

## 2018-10-12 DIAGNOSIS — R002 Palpitations: Secondary | ICD-10-CM | POA: Diagnosis not present

## 2018-10-12 DIAGNOSIS — Z8679 Personal history of other diseases of the circulatory system: Secondary | ICD-10-CM | POA: Diagnosis not present

## 2018-10-12 DIAGNOSIS — E782 Mixed hyperlipidemia: Secondary | ICD-10-CM | POA: Diagnosis not present

## 2018-10-28 DIAGNOSIS — R002 Palpitations: Secondary | ICD-10-CM | POA: Diagnosis not present

## 2018-10-30 DIAGNOSIS — R002 Palpitations: Secondary | ICD-10-CM | POA: Diagnosis not present

## 2019-01-06 NOTE — Progress Notes (Signed)
PCP: Maryland Pink, MD   Chief Complaint  Patient presents with  . Gynecologic Exam    HPI:      Ms. Brenda Guzman is a 58 y.o. No obstetric history on file. who LMP was No LMP recorded. (Menstrual status: Perimenopausal)., presents today for her annual examination.  Her menses are absent due to menopause. She does not have intermenstrual bleeding. She does not have vasomotor sx.   Sex activity: single partner, contraception - post menopausal status. She does have vaginal dryness, improved with lubricants.  Last Pap: 07/23/17  Results were: no abnormalities /neg HPV DNA.  Hx of STDs: none  Last mammogram: 06/16/18  Results were: normal--routine follow-up in 12 months There is a FH of breast cancer in her mat aunt and pat cousin, genetic testing not indicated. There is no FH of ovarian cancer. The patient does do self-breast exams.  Colonoscopy: colonoscopy 5 years ago without abnormalities.  Repeat due after 5 years due to [place]. Pt to call to sched.  Tobacco use: The patient denies current or previous tobacco use. Alcohol use: none Exercise: moderately active  She does get adequate calcium but not Vitamin D in her diet.  Pt with hx of enlarged thyroid with normal thyroid labs the past few yrs and stable u/s (2015). She is due for labs this year.   Labs with PCP.    Past Medical History:  Diagnosis Date  . Abnormal uterine bleeding   . DVT (deep venous thrombosis) ([place])   . Enlarged thyroid   . Hypercholesterolemia   . Hypertension   . Irregular heart beat   . Panic disorder   . Urge incontinence     Past Surgical History:  Procedure Laterality Date  . CESAREAN SECTION    . EYE SURGERY    . LEG SURGERY      Family History  Problem Relation Age of Onset  . Breast cancer Maternal Aunt 70  . Breast cancer Cousin 16  . Alzheimer's disease Father   . Hypertension Father   . Pancreatic cancer Maternal Grandmother        24s  . Colon cancer Paternal Grandmother       20s  . Transient ischemic attack Paternal Grandfather     Social History   Socioeconomic History  . Marital status: Married    Spouse name: Not on file  . Number of children: Not on file  . Years of education: Not on file  . Highest education level: Not on file  Occupational History  . Not on file  Social Needs  . Financial resource strain: Not on file  . Food insecurity:    Worry: Not on file    Inability: Not on file  . Transportation needs:    Medical: Not on file    Non-medical: Not on file  Tobacco Use  . Smoking status: Never Smoker  . Smokeless tobacco: Never Used  Substance and Sexual Activity  . Alcohol use: Yes    Comment: occ  . Drug use: No  . Sexual activity: Yes  Lifestyle  . Physical activity:    Days per week: Not on file    Minutes per session: Not on file  . Stress: Not on file  Relationships  . Social connections:    Talks on phone: Not on file    Gets together: Not on file    Attends religious service: Not on file    Active member of club or organization: Not on file  Attends meetings of clubs or organizations: Not on file    Relationship status: Not on file  . Intimate partner violence:    Fear of current or ex partner: Not on file    Emotionally abused: Not on file    Physically abused: Not on file    Forced sexual activity: Not on file  Other Topics Concern  . Not on file  Social History Narrative  . Not on file    Current Meds  Medication Sig  . acetaminophen (TYLENOL) 500 MG tablet Take 1,000 mg by mouth every 6 (six) hours as needed for mild pain.  Marland Kitchen ALPRAZolam (XANAX) 1 MG tablet TAKE 1/2 TO 1 TABLET BY MOUTH NIGHTLY AS NEEDED FOR SLEEP  . aspirin EC 81 MG tablet Take 81 mg by mouth daily.  Marland Kitchen atorvastatin (LIPITOR) 20 MG tablet Take 10 mg by mouth daily.  Marland Kitchen azelastine (ASTELIN) 0.1 % nasal spray PLACE 1 SPRAY INTO BOTH NOSTRILS 2 (TWO) TIMES DAILY  . Magnesium (V-R MAGNESIUM) 250 MG TABS Take by mouth.  . metoprolol  succinate (TOPROL-XL) 25 MG 24 hr tablet Take 25 mg by mouth daily.      ROS:  Review of Systems  Constitutional: Negative for fatigue, fever and unexpected weight change.  Respiratory: Negative for cough, shortness of breath and wheezing.   Cardiovascular: Negative for chest pain, palpitations and leg swelling.  Gastrointestinal: Negative for blood in stool, constipation, diarrhea, nausea and vomiting.  Endocrine: Negative for cold intolerance, heat intolerance and polyuria.  Genitourinary: Negative for dyspareunia, dysuria, flank pain, frequency, genital sores, hematuria, menstrual problem, pelvic pain, urgency, vaginal bleeding, vaginal discharge and vaginal pain.  Musculoskeletal: Negative for back pain, joint swelling and myalgias.  Skin: Negative for rash.  Neurological: Negative for dizziness, syncope, light-headedness, numbness and headaches.  Hematological: Negative for adenopathy.  Psychiatric/Behavioral: Negative for agitation, confusion, sleep disturbance and suicidal ideas. The patient is not nervous/anxious.      Objective: BP 130/80   Pulse 70   Ht 5\' 3"  (1.6 m)   Wt 131 lb (59.4 kg)   BMI 23.21 kg/m    Physical Exam Constitutional:      Appearance: She is well-developed.  Genitourinary:     Vulva, vagina, uterus, right adnexa and left adnexa normal.     No vulval lesion or tenderness noted.     No vaginal discharge, erythema or tenderness.     No cervical motion tenderness or polyp.     Uterus is not enlarged or tender.     No right or left adnexal mass present.     Right adnexa not tender.     Left adnexa not tender.  Neck:     Musculoskeletal: Normal range of motion.     Thyroid: Thyromegaly present.  Cardiovascular:     Rate and Rhythm: Normal rate and regular rhythm.     Heart sounds: Normal heart sounds. No murmur.  Pulmonary:     Effort: Pulmonary effort is normal.     Breath sounds: Normal breath sounds.  Chest:     Breasts:        Right:  No mass, nipple discharge, skin change or tenderness.        Left: No mass, nipple discharge, skin change or tenderness.  Abdominal:     Palpations: Abdomen is soft.     Tenderness: There is no abdominal tenderness. There is no guarding.  Musculoskeletal: Normal range of motion.  Neurological:     Mental Status: She  is alert and oriented to person, place, and time.     Cranial Nerves: No cranial nerve deficit.  Psychiatric:        Behavior: Behavior normal.  Vitals signs reviewed.     Assessment/Plan:  Encounter for annual routine gynecological examination  Screening for breast cancer - Pt to sched mammo - Plan: MM 3D SCREEN BREAST BILATERAL  Enlarged thyroid - Labs due. - Plan: TSH + free T4          GYN counsel mammography screening, adequate intake of calcium and vitamin D    F/U  Return in about 1 year (around 01/08/2020).  Adyline Huberty B. Aliyana Dlugosz, PA-C 01/07/2019 8:53 AM

## 2019-01-07 ENCOUNTER — Encounter: Payer: Self-pay | Admitting: Obstetrics and Gynecology

## 2019-01-07 ENCOUNTER — Ambulatory Visit (INDEPENDENT_AMBULATORY_CARE_PROVIDER_SITE_OTHER): Payer: Commercial Managed Care - PPO | Admitting: Obstetrics and Gynecology

## 2019-01-07 VITALS — BP 130/80 | HR 70 | Ht 63.0 in | Wt 131.0 lb

## 2019-01-07 DIAGNOSIS — E049 Nontoxic goiter, unspecified: Secondary | ICD-10-CM | POA: Diagnosis not present

## 2019-01-07 DIAGNOSIS — Z01419 Encounter for gynecological examination (general) (routine) without abnormal findings: Secondary | ICD-10-CM

## 2019-01-07 DIAGNOSIS — Z1239 Encounter for other screening for malignant neoplasm of breast: Secondary | ICD-10-CM

## 2019-01-07 NOTE — Patient Instructions (Signed)
I value your feedback and entrusting us with your care. If you get a [facility] patient survey, I would appreciate you taking the time to let us know about your experience today. Thank you! 

## 2019-01-08 LAB — TSH+FREE T4
FREE T4: 1.14 ng/dL (ref 0.82–1.77)
TSH: 2.11 u[IU]/mL (ref 0.450–4.500)

## 2019-01-08 NOTE — Progress Notes (Signed)
Pls let pt know labs normal. Thx

## 2019-01-08 NOTE — Progress Notes (Signed)
Pt aware.

## 2019-01-08 NOTE — Progress Notes (Signed)
Called, no answer, [facility].

## 2019-03-30 DIAGNOSIS — R739 Hyperglycemia, unspecified: Secondary | ICD-10-CM | POA: Diagnosis not present

## 2019-03-30 DIAGNOSIS — R5381 Other malaise: Secondary | ICD-10-CM | POA: Diagnosis not present

## 2019-03-30 DIAGNOSIS — R5383 Other fatigue: Secondary | ICD-10-CM | POA: Diagnosis not present

## 2019-03-30 DIAGNOSIS — R531 Weakness: Secondary | ICD-10-CM | POA: Diagnosis not present

## 2019-03-30 DIAGNOSIS — E049 Nontoxic goiter, unspecified: Secondary | ICD-10-CM | POA: Diagnosis not present

## 2019-04-01 DIAGNOSIS — R5383 Other fatigue: Secondary | ICD-10-CM | POA: Diagnosis not present

## 2019-04-01 DIAGNOSIS — R0602 Shortness of breath: Secondary | ICD-10-CM | POA: Diagnosis not present

## 2019-04-01 DIAGNOSIS — Z8679 Personal history of other diseases of the circulatory system: Secondary | ICD-10-CM | POA: Diagnosis not present

## 2019-04-01 DIAGNOSIS — E782 Mixed hyperlipidemia: Secondary | ICD-10-CM | POA: Diagnosis not present

## 2019-04-02 ENCOUNTER — Other Ambulatory Visit: Payer: Self-pay | Admitting: Family Medicine

## 2019-04-02 DIAGNOSIS — E049 Nontoxic goiter, unspecified: Secondary | ICD-10-CM

## 2019-04-05 DIAGNOSIS — R531 Weakness: Secondary | ICD-10-CM | POA: Diagnosis not present

## 2019-04-05 DIAGNOSIS — R202 Paresthesia of skin: Secondary | ICD-10-CM | POA: Diagnosis not present

## 2019-04-06 ENCOUNTER — Other Ambulatory Visit: Payer: Self-pay

## 2019-04-06 ENCOUNTER — Encounter (HOSPITAL_COMMUNITY): Payer: Self-pay | Admitting: Emergency Medicine

## 2019-04-06 ENCOUNTER — Emergency Department (HOSPITAL_COMMUNITY)
Admission: EM | Admit: 2019-04-06 | Discharge: 2019-04-06 | Disposition: A | Discharge status: Home / Self Care | Payer: Commercial Managed Care - PPO | Attending: Emergency Medicine | Admitting: Emergency Medicine

## 2019-04-06 DIAGNOSIS — I10 Essential (primary) hypertension: Secondary | ICD-10-CM | POA: Insufficient documentation

## 2019-04-06 DIAGNOSIS — R079 Chest pain, unspecified: Secondary | ICD-10-CM | POA: Diagnosis not present

## 2019-04-06 DIAGNOSIS — R531 Weakness: Secondary | ICD-10-CM | POA: Diagnosis present

## 2019-04-06 DIAGNOSIS — Z79899 Other long term (current) drug therapy: Secondary | ICD-10-CM | POA: Insufficient documentation

## 2019-04-06 DIAGNOSIS — Z7982 Long term (current) use of aspirin: Secondary | ICD-10-CM | POA: Diagnosis not present

## 2019-04-06 DIAGNOSIS — R5383 Other fatigue: Secondary | ICD-10-CM | POA: Diagnosis not present

## 2019-04-06 LAB — URINALYSIS, ROUTINE W REFLEX MICROSCOPIC
Bilirubin Urine: NEGATIVE
Glucose, UA: NEGATIVE mg/dL
Hgb urine dipstick: NEGATIVE
Ketones, ur: 5 mg/dL — AB
Leukocytes,Ua: NEGATIVE
Nitrite: NEGATIVE
Protein, ur: NEGATIVE mg/dL
Specific Gravity, Urine: 1.005 (ref 1.005–1.030)
pH: 7 (ref 5.0–8.0)

## 2019-04-06 LAB — COMPREHENSIVE METABOLIC PANEL
ALT: 15 U/L (ref 0–44)
AST: 17 U/L (ref 15–41)
Albumin: 4 g/dL (ref 3.5–5.0)
Alkaline Phosphatase: 80 U/L (ref 38–126)
Anion gap: 5 (ref 5–15)
BUN: 10 mg/dL (ref 6–20)
CO2: 26 mmol/L (ref 22–32)
Calcium: 9.5 mg/dL (ref 8.9–10.3)
Chloride: 105 mmol/L (ref 98–111)
Creatinine, Ser: 0.94 mg/dL (ref 0.44–1.00)
GFR calc Af Amer: >60 mL/min (ref >60)
GFR calc non Af Amer: >60 mL/min (ref >60)
Glucose, Bld: 129 mg/dL — ABNORMAL HIGH (ref 70–99)
Potassium: 4.1 mmol/L (ref 3.5–5.1)
Sodium: 136 mmol/L (ref 135–145)
Total Bilirubin: 0.7 mg/dL (ref 0.3–1.2)
Total Protein: 7 g/dL (ref 6.5–8.1)

## 2019-04-06 LAB — CBC WITH DIFFERENTIAL/PLATELET
Abs Immature Granulocytes: 0.02 10*3/uL (ref 0.00–0.07)
Basophils Absolute: 0 10*3/uL (ref 0.0–0.1)
Basophils Relative: 0 %
Eosinophils Absolute: 0 10*3/uL (ref 0.0–0.5)
Eosinophils Relative: 0 %
HCT: 40.4 % (ref 36.0–46.0)
Hemoglobin: 13.2 g/dL (ref 12.0–15.0)
Immature Granulocytes: 0 %
Lymphocytes Relative: 14 %
Lymphs Abs: 1.1 10*3/uL (ref 0.7–4.0)
MCH: 30.2 pg (ref 26.0–34.0)
MCHC: 32.7 g/dL (ref 30.0–36.0)
MCV: 92.4 fL (ref 80.0–100.0)
Monocytes Absolute: 0.4 10*3/uL (ref 0.1–1.0)
Monocytes Relative: 5 %
Neutro Abs: 6.5 10*3/uL (ref 1.7–7.7)
Neutrophils Relative %: 81 %
Platelets: 258 10*3/uL (ref 150–400)
RBC: 4.37 MIL/uL (ref 3.87–5.11)
RDW: 12.8 % (ref 11.5–15.5)
WBC: 8.1 10*3/uL (ref 4.0–10.5)
nRBC: 0 % (ref 0.0–0.2)

## 2019-04-06 LAB — MONONUCLEOSIS SCREEN: Mono Screen: NEGATIVE

## 2019-04-06 LAB — I-STAT BETA HCG BLOOD, ED (MC, WL, AP ONLY): I-stat hCG, quantitative: <5 m[IU]/mL (ref <5)

## 2019-04-06 MED ORDER — SODIUM CHLORIDE 0.9 % IV BOLUS
500.0000 mL | Freq: Once | INTRAVENOUS | Status: AC
  Administered 2019-04-06: 500 mL via INTRAVENOUS

## 2019-04-06 NOTE — ED Provider Notes (Signed)
[facility] EMERGENCY DEPARTMENT Provider Note   CSN: [id number] Arrival date & time: 04/06/19  1057    History   Chief Complaint Chief Complaint  Patient presents with  . Weakness  . Shortness of Breath    HPI [name] is a 58 y.o. female.     Patient is 58 year old female with a history of hypertension, hyperlipidemia and SVT, along with anxiety who presents with fatigue.  She states she has been feeling bad for about 10 days.  She states she feels generally fatigued.  In the morning sometimes she feels like she cannot get out of bed.  She denies any specific shortness of breath.  No cough or cold symptoms.  No fevers.  She says she feels very weak in her arms and her legs but has no unilateral symptoms.  No numbness in her extremities.  No chest pain or tightness although she says occasionally she gets a tightness in her chest but this is intermittent has been going on for a long period time.  It does not seem to be related to the fatigue.  She denies any urinary symptoms.  No nausea or vomiting.  She has little bit of loose stool in the morning but has normal stools otherwise.  She has been seen by her cardiologist and her PCP who have not found any etiology for symptoms.  It was felt to be may be related to her anxiety.  She is recently been started on Lexapro and some Xanax tablets.  She denies any thoughts of self-harm.     Past Medical History:  Diagnosis Date  . Abnormal uterine bleeding   . DVT (deep venous thrombosis) ([place])   . Enlarged thyroid   . Hypercholesterolemia   . Hypertension   . Irregular heart beat   . Panic disorder   . Urge incontinence     Patient Active Problem List   Diagnosis Date Noted  . Enlarged thyroid 07/23/2017    Past Surgical History:  Procedure Laterality Date  . CESAREAN SECTION    . EYE SURGERY    . LEG SURGERY       OB History    Gravida  3   Para  1   Term  1   Preterm      AB  2   Living  1      SAB  2   TAB      Ectopic      Multiple      Live Births  1            Home Medications    Prior to Admission medications   Medication Sig Start Date End Date Taking? Authorizing Provider  acetaminophen (TYLENOL) 500 MG tablet Take 1,000 mg by mouth every 6 (six) hours as needed for mild pain.    [provider]  ALPRAZolam (XANAX) 1 MG tablet TAKE 1/2 TO 1 TABLET BY MOUTH NIGHTLY AS NEEDED FOR SLEEP 08/19/16   [provider]  aspirin EC 81 MG tablet Take 81 mg by mouth daily.    [provider]  atorvastatin (LIPITOR) 20 MG tablet Take 10 mg by mouth daily. 09/10/17   [provider]  azelastine (ASTELIN) 0.1 % nasal spray PLACE 1 SPRAY INTO BOTH NOSTRILS 2 (TWO) TIMES DAILY 10/21/17   [provider]  Magnesium (V-R MAGNESIUM) 250 MG TABS Take by mouth.    [provider]  metoprolol succinate (TOPROL-XL) 25 MG 24 hr tablet  Take 25 mg by mouth daily. 09/08/17   [provider]    Family History Family History  Problem Relation Age of Onset  . Breast cancer Maternal Aunt 70  . Breast cancer Cousin 55  . Alzheimer's disease Father   . Hypertension Father   . Pancreatic cancer Maternal Grandmother        5s  . Colon cancer Paternal Grandmother        31s  . Transient ischemic attack Paternal Grandfather     Social History Social History   Tobacco Use  . Smoking status: Never Smoker  . Smokeless tobacco: Never Used  Substance Use Topics  . Alcohol use: Yes    Comment: occ  . Drug use: No     Allergies   Patient has no known allergies.   Review of Systems Review of Systems  Constitutional: Positive for fatigue. Negative for chills, diaphoresis and fever.  HENT: Negative for congestion, rhinorrhea and sneezing.   Eyes: Negative.   Respiratory: Negative for cough, chest tightness and shortness of breath.   Cardiovascular: Positive for chest pain (Intermittent and unchanged from her  baseline). Negative for leg swelling.  Gastrointestinal: Negative for abdominal pain, blood in stool, diarrhea, nausea and vomiting.  Genitourinary: Negative for difficulty urinating, flank pain, frequency and hematuria.  Musculoskeletal: Negative for arthralgias and back pain.  Skin: Negative for rash.  Neurological: Positive for weakness (Generalized). Negative for dizziness, speech difficulty, numbness and headaches.  Psychiatric/Behavioral: The patient is nervous/anxious.      Physical Exam Updated Vital Signs BP (!) 144/66   Pulse (!) 56   Temp 98.4 F (36.9 C) (Oral)   Resp 16   Ht 5\' 2"  (1.575 m)   Wt 54.4 kg   SpO2 100%   BMI 21.95 kg/m   Physical Exam Constitutional:      Appearance: She is well-developed.  HENT:     Head: Normocephalic and atraumatic.  Eyes:     Pupils: Pupils are equal, round, and reactive to light.  Neck:     Musculoskeletal: Normal range of motion and neck supple.  Cardiovascular:     Rate and Rhythm: Normal rate and regular rhythm.     Heart sounds: Normal heart sounds.  Pulmonary:     Effort: Pulmonary effort is normal. No respiratory distress.     Breath sounds: Normal breath sounds. No wheezing or rales.  Chest:     Chest wall: No tenderness.  Abdominal:     General: Bowel sounds are normal.     Palpations: Abdomen is soft.     Tenderness: There is no abdominal tenderness. There is no guarding or rebound.  Musculoskeletal: Normal range of motion.  Lymphadenopathy:     Cervical: No cervical adenopathy.  Skin:    General: Skin is warm and dry.     Findings: No rash.  Neurological:     Mental Status: She is alert and oriented to person, place, and time.     Comments: Motor 5/5 all extremities Sensation grossly intact to LT all extremities Finger to Nose intact, no pronator drift CN II-XII grossly intact        ED Treatments / Results  Labs (all labs ordered are listed, but only abnormal results are displayed) Labs  Reviewed  COMPREHENSIVE METABOLIC PANEL - Abnormal; Notable for the following components:      Result Value   Glucose, Bld 129 (*)    All other components within normal limits  URINALYSIS, ROUTINE W REFLEX  MICROSCOPIC - Abnormal; Notable for the following components:   Color, Urine COLORLESS (*)    Ketones, ur 5 (*)    All other components within normal limits  CBC WITH DIFFERENTIAL/PLATELET  MONONUCLEOSIS SCREEN  I-STAT BETA HCG BLOOD, ED ([facility], [facility], [facility] ONLY)    EKG EKG Interpretation  Date/Time:  Tuesday Apr 06 2019 11:33:53 EDT Ventricular Rate:  61 PR Interval:    QRS Duration: 90 QT Interval:  411 QTC Calculation: 414 R Axis:   71 Text Interpretation:  Sinus rhythm Borderline short PR interval since last tracing no significant change Confirmed by Malvin Johns [phone]) on 04/06/2019 1:13:50 PM   Radiology No results found.  Procedures Procedures (including critical care time)  Medications Ordered in ED Medications  sodium chloride 0.9 % bolus 500 mL (0 mLs Intravenous Stopped 04/06/19 1348)     Initial Impression / Assessment and Plan / ED Course  I have reviewed the triage vital signs and the nursing notes.  Pertinent labs & imaging results that were available during my care of the patient were reviewed by me and considered in my medical decision making (see chart for details).       Patient is a 58 year old female who presents with fatigue.  She does not have any other specific symptoms.  No abnormal physical exam findings.  She is neurologically intact.  She does not have symptoms that sound more concerning for ACS.  She has no neurologic deficits.  She has a flat affect and appears to be depressed but she is denying any thoughts of self-harm or worsening depression.  Her labs are non-concerning.  She had a recent TSH/thyroid panel.  Her urinalysis does not appear infected.  Her vital signs are stable.  She was discharged home in good condition.  She was encouraged  to follow-up with her PCP.  Final Clinical Impressions(s) / ED Diagnoses   Final diagnoses:  Fatigue, unspecified type    ED Discharge Orders    None       Malvin Johns, MD 04/06/19 1502

## 2019-04-06 NOTE — ED Triage Notes (Signed)
Pt. Stated, Im having weakness and some SOB, went to my Dr. Wilburn Mylar in [place] and he said I was having panic atacks and anxiety.  Im still feeling weak and some SOB, I was given Lexapro and Xanax , I took the 2 last night, and I feel worse this morning than I ever had.

## 2019-04-07 DIAGNOSIS — R0602 Shortness of breath: Secondary | ICD-10-CM | POA: Diagnosis not present

## 2019-04-07 DIAGNOSIS — R5381 Other malaise: Secondary | ICD-10-CM | POA: Diagnosis not present

## 2019-04-08 DIAGNOSIS — R0602 Shortness of breath: Secondary | ICD-10-CM | POA: Diagnosis not present

## 2019-04-13 ENCOUNTER — Other Ambulatory Visit: Payer: Self-pay

## 2019-04-13 ENCOUNTER — Ambulatory Visit
Admission: RE | Admit: 2019-04-13 | Discharge: 2019-04-13 | Disposition: A | Discharge status: Home / Self Care | Payer: Commercial Managed Care - PPO | Source: Ambulatory Visit | Attending: Family Medicine | Admitting: Family Medicine

## 2019-04-13 DIAGNOSIS — E049 Nontoxic goiter, unspecified: Secondary | ICD-10-CM | POA: Insufficient documentation

## 2020-03-29 ENCOUNTER — Other Ambulatory Visit: Payer: Self-pay | Admitting: Family Medicine

## 2020-03-29 DIAGNOSIS — E041 Nontoxic single thyroid nodule: Secondary | ICD-10-CM

## 2020-03-29 DIAGNOSIS — Z09 Encounter for follow-up examination after completed treatment for conditions other than malignant neoplasm: Secondary | ICD-10-CM

## 2020-04-12 ENCOUNTER — Ambulatory Visit
Admission: RE | Admit: 2020-04-12 | Discharge: 2020-04-12 | Disposition: A | Discharge status: Home / Self Care | Payer: Commercial Managed Care - PPO | Source: Ambulatory Visit | Attending: Family Medicine | Admitting: Family Medicine

## 2020-04-12 ENCOUNTER — Other Ambulatory Visit: Payer: Self-pay

## 2020-04-12 DIAGNOSIS — Z09 Encounter for follow-up examination after completed treatment for conditions other than malignant neoplasm: Secondary | ICD-10-CM | POA: Diagnosis present

## 2020-04-12 DIAGNOSIS — E041 Nontoxic single thyroid nodule: Secondary | ICD-10-CM | POA: Insufficient documentation

## 2020-12-25 ENCOUNTER — Other Ambulatory Visit: Payer: Self-pay | Admitting: Obstetrics and Gynecology

## 2021-02-15 ENCOUNTER — Ambulatory Visit: Payer: Commercial Managed Care - PPO | Admitting: Obstetrics and Gynecology

## 2021-02-27 ENCOUNTER — Ambulatory Visit: Payer: Commercial Managed Care - PPO | Admitting: Obstetrics and Gynecology

## 2021-03-22 ENCOUNTER — Ambulatory Visit (INDEPENDENT_AMBULATORY_CARE_PROVIDER_SITE_OTHER): Payer: Commercial Managed Care - PPO | Admitting: Obstetrics and Gynecology

## 2021-03-22 ENCOUNTER — Other Ambulatory Visit (HOSPITAL_COMMUNITY)
Admission: RE | Admit: 2021-03-22 | Discharge: 2021-03-22 | Disposition: A | Discharge status: Home / Self Care | Payer: Commercial Managed Care - PPO | Source: Ambulatory Visit | Attending: Obstetrics and Gynecology | Admitting: Obstetrics and Gynecology

## 2021-03-22 ENCOUNTER — Other Ambulatory Visit: Payer: Self-pay

## 2021-03-22 ENCOUNTER — Encounter: Payer: Self-pay | Admitting: Obstetrics and Gynecology

## 2021-03-22 VITALS — BP 102/78 | Ht 63.0 in | Wt 133.0 lb

## 2021-03-22 DIAGNOSIS — Z1151 Encounter for screening for human papillomavirus (HPV): Secondary | ICD-10-CM | POA: Diagnosis present

## 2021-03-22 DIAGNOSIS — Z01419 Encounter for gynecological examination (general) (routine) without abnormal findings: Secondary | ICD-10-CM

## 2021-03-22 DIAGNOSIS — Z124 Encounter for screening for malignant neoplasm of cervix: Secondary | ICD-10-CM

## 2021-03-22 DIAGNOSIS — Z1231 Encounter for screening mammogram for malignant neoplasm of breast: Secondary | ICD-10-CM

## 2021-03-22 DIAGNOSIS — Z1211 Encounter for screening for malignant neoplasm of colon: Secondary | ICD-10-CM

## 2021-03-22 NOTE — Progress Notes (Signed)
PCP: Maryland Pink, MD   Chief Complaint  Patient presents with  . Gynecologic Exam    No concerns    HPI:      Ms. Brenda Guzman is a 60 y.o. No obstetric history on file. who LMP was No LMP recorded. (Menstrual status: Perimenopausal)., presents today for her annual examination.  Her menses are absent due to menopause. She does not have PMB. She does not have vasomotor sx.   Sex activity: not sexually active currently, no vag sx.  Last Pap: 07/23/17  Results were: no abnormalities /neg HPV DNA.  Hx of STDs: none  Last mammogram: 06/16/18  Results were: normal--routine follow-up in 12 months There is a FH of breast cancer in her mat aunt and pat cousin, genetic testing not indicated. There is no FH of ovarian cancer. The patient does  self-breast exams.  Colonoscopy: colonoscopy 5 years ago without abnormalities.  Repeat due after 5 years due to [place], not done due to covid.  Tobacco use: The patient denies current or previous tobacco use. Alcohol use: none  No drug use Exercise: moderately active  She does get adequate calcium but not Vitamin D in her diet.  Pt with hx of enlarged thyroid with normal thyroid labs the past few yrs and stable u/s (2020 and 2021), followed by PCP.  Labs with PCP.    Past Medical History:  Diagnosis Date  . Abnormal uterine bleeding   . DVT (deep venous thrombosis) ([place])   . Enlarged thyroid   . Hypercholesterolemia   . Hypertension   . Irregular heart beat   . Panic disorder   . Urge incontinence     Past Surgical History:  Procedure Laterality Date  . CESAREAN SECTION    . EYE SURGERY    . LEG SURGERY      Family History  Problem Relation Age of Onset  . Breast cancer Maternal Aunt 70  . Breast cancer Cousin 57  . Alzheimer's disease Father   . Hypertension Father   . Bladder Cancer Maternal Grandmother        68s  . Colon cancer Paternal Grandmother        87s  . Transient ischemic attack Paternal Grandfather      Social History   Socioeconomic History  . Marital status: Married    Spouse name: Not on file  . Number of children: Not on file  . Years of education: Not on file  . Highest education level: Not on file  Occupational History  . Not on file  Tobacco Use  . Smoking status: Never Smoker  . Smokeless tobacco: Never Used  Vaping Use  . Vaping Use: Never used  Substance and Sexual Activity  . Alcohol use: Yes    Comment: occ  . Drug use: No  . Sexual activity: Not Currently  Other Topics Concern  . Not on file  Social History Narrative  . Not on file   Social Determinants of Health   Financial Resource Strain: Not on file  Food Insecurity: Not on file  Transportation Needs: Not on file  Physical Activity: Not on file  Stress: Not on file  Social Connections: Not on file  Intimate Partner Violence: Not on file    Current Meds  Medication Sig  . acetaminophen (TYLENOL) 500 MG tablet Take 1,000 mg by mouth every 6 (six) hours as needed for mild pain.  Marland Kitchen aspirin EC 81 MG tablet Take 81 mg by mouth daily.  Marland Kitchen  atorvastatin (LIPITOR) 20 MG tablet Take 10 mg by mouth daily.  Marland Kitchen azelastine (ASTELIN) 0.1 % nasal spray PLACE 1 SPRAY INTO BOTH NOSTRILS 2 (TWO) TIMES DAILY  . fluticasone (FLONASE) 50 MCG/ACT nasal spray Place into the nose.  . Magnesium 250 MG TABS Take by mouth.  . meclizine (ANTIVERT) 25 MG tablet Take by mouth.  . metoprolol succinate (TOPROL-XL) 25 MG 24 hr tablet Take 25 mg by mouth daily.  Marland Kitchen PARoxetine (PAXIL) 20 MG tablet Take 20 mg by mouth daily.  . rosuvastatin (CRESTOR) 10 MG tablet Take 10 mg by mouth daily.      ROS:  Review of Systems  Constitutional: Negative for fatigue, fever and unexpected weight change.  Respiratory: Negative for cough, shortness of breath and wheezing.   Cardiovascular: Negative for chest pain, palpitations and leg swelling.  Gastrointestinal: Negative for blood in stool, constipation, diarrhea, nausea and vomiting.   Endocrine: Negative for cold intolerance, heat intolerance and polyuria.  Genitourinary: Negative for dyspareunia, dysuria, flank pain, frequency, genital sores, hematuria, menstrual problem, pelvic pain, urgency, vaginal bleeding, vaginal discharge and vaginal pain.  Musculoskeletal: Negative for back pain, joint swelling and myalgias.  Skin: Negative for rash.  Neurological: Negative for dizziness, syncope, light-headedness, numbness and headaches.  Hematological: Negative for adenopathy.  Psychiatric/Behavioral: Negative for agitation, confusion, sleep disturbance and suicidal ideas. The patient is not nervous/anxious.      Objective: BP 102/78   Ht 5\' 3"  (1.6 m)   Wt 133 lb (60.3 kg)   BMI 23.56 kg/m    Physical Exam Constitutional:      Appearance: She is well-developed.  Genitourinary:     Vulva normal.     Right Labia: No rash, tenderness or lesions.    Left Labia: No tenderness, lesions or rash.    No vaginal discharge, erythema or tenderness.      Right Adnexa: not tender and no mass present.    Left Adnexa: not tender and no mass present.    No cervical motion tenderness, friability or polyp.     Uterus is not enlarged or tender.  Breasts:     Right: No mass, nipple discharge, skin change or tenderness.     Left: No mass, nipple discharge, skin change or tenderness.    Neck:     Thyroid: Thyromegaly present.  Cardiovascular:     Rate and Rhythm: Normal rate and regular rhythm.     Heart sounds: Normal heart sounds. No murmur heard.   Pulmonary:     Effort: Pulmonary effort is normal.     Breath sounds: Normal breath sounds.  Abdominal:     Palpations: Abdomen is soft.     Tenderness: There is no abdominal tenderness. There is no guarding or rebound.  Musculoskeletal:        General: Normal range of motion.     Cervical back: Normal range of motion.  Lymphadenopathy:     Cervical: No cervical adenopathy.  Neurological:     General: No focal deficit  present.     Mental Status: She is alert and oriented to person, place, and time.     Cranial Nerves: No cranial nerve deficit.  Skin:    General: Skin is warm and dry.  Psychiatric:        Mood and Affect: Mood normal.        Behavior: Behavior normal.        Thought Content: Thought content normal.        Judgment: Judgment  normal.  Vitals reviewed.     Assessment/Plan:  Encounter for annual routine gynecological examination  Cervical cancer screening - Plan: Cytology - PAP  Screening for HPV (human papillomavirus) - Plan: Cytology - PAP  Encounter for screening mammogram for malignant neoplasm of breast - Plan: MM 3D SCREEN BREAST BILATERAL; pt to sched mammo  Screening for colon cancer - Plan: Ambulatory referral to Gastroenterology; refer to [facility] for scr colonoscopy          GYN counsel mammography screening, adequate intake of calcium and vitamin D    F/U  Return in about 1 year (around 03/22/2022).  Jocilynn Grade B. Finn Amos, PA-C 03/22/2021 4:30 PM

## 2021-03-22 NOTE — Patient Instructions (Signed)
I value your feedback and you entrusting us with your care. If you get a [facility] patient survey, I would appreciate you taking the time to let us know about your experience today. Thank you! ? ? ?

## 2021-03-26 LAB — CYTOLOGY - PAP
Comment: NEGATIVE
Diagnosis: NEGATIVE
High risk HPV: NEGATIVE

## 2021-04-20 ENCOUNTER — Other Ambulatory Visit: Payer: Self-pay

## 2021-04-20 ENCOUNTER — Ambulatory Visit
Admission: RE | Admit: 2021-04-20 | Discharge: 2021-04-20 | Disposition: A | Discharge status: Home / Self Care | Payer: Commercial Managed Care - PPO | Source: Ambulatory Visit | Attending: Obstetrics and Gynecology | Admitting: Obstetrics and Gynecology

## 2021-04-20 DIAGNOSIS — Z1231 Encounter for screening mammogram for malignant neoplasm of breast: Secondary | ICD-10-CM | POA: Insufficient documentation

## 2021-08-07 ENCOUNTER — Ambulatory Visit: Payer: Commercial Managed Care - PPO | Admitting: Physical Therapy

## 2021-08-14 ENCOUNTER — Ambulatory Visit: Payer: Commercial Managed Care - PPO | Attending: Family Medicine | Admitting: Physical Therapy

## 2021-08-22 ENCOUNTER — Ambulatory Visit: Payer: Commercial Managed Care - PPO | Admitting: Physical Therapy

## 2021-08-28 ENCOUNTER — Ambulatory Visit: Payer: Commercial Managed Care - PPO | Admitting: Physical Therapy

## 2021-09-04 ENCOUNTER — Encounter: Payer: Commercial Managed Care - PPO | Admitting: Physical Therapy

## 2021-09-11 ENCOUNTER — Encounter: Payer: Commercial Managed Care - PPO | Admitting: Physical Therapy

## 2021-09-18 ENCOUNTER — Encounter: Payer: Commercial Managed Care - PPO | Admitting: Physical Therapy

## 2021-09-25 ENCOUNTER — Encounter: Payer: Commercial Managed Care - PPO | Admitting: Physical Therapy

## 2021-10-02 ENCOUNTER — Encounter: Payer: Commercial Managed Care - PPO | Admitting: Physical Therapy

## 2021-10-09 ENCOUNTER — Encounter: Payer: Commercial Managed Care - PPO | Admitting: Physical Therapy

## 2023-02-17 NOTE — Progress Notes (Unsigned)
PCP: Maryland Pink, MD   No chief complaint on file.   HPI:      Ms. Brenda Guzman is a 62 y.o. No obstetric history on file. who LMP was No LMP recorded. Patient is postmenopausal., presents today for her annual examination.  Her menses are absent due to menopause. She does not have PMB. She does not have vasomotor sx.   Sex activity: not sexually active currently, no vag sx.  Last Pap: 03/22/21 Results were: no abnormalities /neg HPV DNA.  Hx of STDs: none  Last mammogram: 04/20/21 Results were: normal--routine follow-up in 12 months There is a FH of breast cancer in her mat aunt and pat cousin, genetic testing not indicated. There is no FH of ovarian cancer. The patient does  self-breast exams.  Colonoscopy: colonoscopy 5 years ago without abnormalities.  Repeat due after 5 years due to [place], not done due to covid.  Tobacco use: The patient denies current or previous tobacco use. Alcohol use: none  No drug use Exercise: moderately active  She does get adequate calcium but not Vitamin D in her diet.  Pt with hx of enlarged thyroid with normal thyroid labs the past few yrs and stable u/s (2020 and 2021), followed by PCP.  Labs with PCP.    Past Medical History:  Diagnosis Date   Abnormal uterine bleeding    DVT (deep venous thrombosis) (HCC)    Enlarged thyroid    Hypercholesterolemia    Hypertension    Irregular heart beat    Panic disorder    Urge incontinence     Past Surgical History:  Procedure Laterality Date   CESAREAN SECTION     EYE SURGERY     LEG SURGERY      Family History  Problem Relation Age of Onset   Breast cancer Maternal Aunt 28   Breast cancer Cousin 68   Alzheimer's disease Father    Hypertension Father    Bladder Cancer Maternal Grandmother        30s   Colon cancer Paternal Grandmother        49s   Transient ischemic attack Paternal Grandfather     Social History   Socioeconomic History   Marital status: Married    Spouse name:  Not on file   Number of children: Not on file   Years of education: Not on file   Highest education level: Not on file  Occupational History   Not on file  Tobacco Use   Smoking status: Never   Smokeless tobacco: Never  Vaping Use   Vaping Use: Never used  Substance and Sexual Activity   Alcohol use: Yes    Comment: occ   Drug use: No   Sexual activity: Not Currently  Other Topics Concern   Not on file  Social History Narrative   Not on file   Social Determinants of Health   Financial Resource Strain: Not on file  Food Insecurity: Not on file  Transportation Needs: Not on file  Physical Activity: Not on file  Stress: Not on file  Social Connections: Not on file  Intimate Partner Violence: Not on file    No outpatient medications have been marked as taking for the 02/18/23 encounter (Appointment) with Brenda Guzman, Deirdre Evener, PA-C.      ROS:  Review of Systems  Constitutional:  Negative for fatigue, fever and unexpected weight change.  Respiratory:  Negative for cough, shortness of breath and wheezing.   Cardiovascular:  Negative for  chest pain, palpitations and leg swelling.  Gastrointestinal:  Negative for blood in stool, constipation, diarrhea, nausea and vomiting.  Endocrine: Negative for cold intolerance, heat intolerance and polyuria.  Genitourinary:  Negative for dyspareunia, dysuria, flank pain, frequency, genital sores, hematuria, menstrual problem, pelvic pain, urgency, vaginal bleeding, vaginal discharge and vaginal pain.  Musculoskeletal:  Negative for back pain, joint swelling and myalgias.  Skin:  Negative for rash.  Neurological:  Negative for dizziness, syncope, light-headedness, numbness and headaches.  Hematological:  Negative for adenopathy.  Psychiatric/Behavioral:  Negative for agitation, confusion, sleep disturbance and suicidal ideas. The patient is not nervous/anxious.      Objective: There were no vitals taken for this visit.   Physical  Exam Constitutional:      Appearance: She is well-developed.  Genitourinary:     Vulva normal.     Right Labia: No rash, tenderness or lesions.    Left Labia: No tenderness, lesions or rash.    No vaginal discharge, erythema or tenderness.      Right Adnexa: not tender and no mass present.    Left Adnexa: not tender and no mass present.    No cervical motion tenderness, friability or polyp.     Uterus is not enlarged or tender.  Breasts:    Right: No mass, nipple discharge, skin change or tenderness.     Left: No mass, nipple discharge, skin change or tenderness.  Neck:     Thyroid: Thyromegaly present.  Cardiovascular:     Rate and Rhythm: Normal rate and regular rhythm.     Heart sounds: Normal heart sounds. No murmur heard. Pulmonary:     Effort: Pulmonary effort is normal.     Breath sounds: Normal breath sounds.  Abdominal:     Palpations: Abdomen is soft.     Tenderness: There is no abdominal tenderness. There is no guarding or rebound.  Musculoskeletal:        General: Normal range of motion.     Cervical back: Normal range of motion.  Lymphadenopathy:     Cervical: No cervical adenopathy.  Neurological:     General: No focal deficit present.     Mental Status: She is alert and oriented to person, place, and time.     Cranial Nerves: No cranial nerve deficit.  Skin:    General: Skin is warm and dry.  Psychiatric:        Mood and Affect: Mood normal.        Behavior: Behavior normal.        Thought Content: Thought content normal.        Judgment: Judgment normal.  Vitals reviewed.     Assessment/Plan:  Encounter for annual routine gynecological examination  Cervical cancer screening - Plan: Cytology - PAP  Screening for HPV (human papillomavirus) - Plan: Cytology - PAP  Encounter for screening mammogram for malignant neoplasm of breast - Plan: MM 3D SCREEN BREAST BILATERAL; pt to sched mammo  Screening for colon cancer - Plan: Ambulatory referral to  Gastroenterology; refer to [facility] for scr colonoscopy          GYN counsel mammography screening, adequate intake of calcium and vitamin D    F/U  No follow-ups on file.  Jhostin Epps B. Karyss Frese, PA-C 02/17/2023 7:57 PM

## 2023-02-18 ENCOUNTER — Encounter: Payer: Self-pay | Admitting: Obstetrics and Gynecology

## 2023-02-18 ENCOUNTER — Ambulatory Visit (INDEPENDENT_AMBULATORY_CARE_PROVIDER_SITE_OTHER): Payer: Managed Care, Other (non HMO) | Admitting: Obstetrics and Gynecology

## 2023-02-18 VITALS — BP 110/64 | Ht 62.5 in | Wt 137.0 lb

## 2023-02-18 DIAGNOSIS — Z1231 Encounter for screening mammogram for malignant neoplasm of breast: Secondary | ICD-10-CM

## 2023-02-18 DIAGNOSIS — N3946 Mixed incontinence: Secondary | ICD-10-CM

## 2023-02-18 DIAGNOSIS — Z01419 Encounter for gynecological examination (general) (routine) without abnormal findings: Secondary | ICD-10-CM | POA: Diagnosis not present

## 2023-02-18 DIAGNOSIS — Z1211 Encounter for screening for malignant neoplasm of colon: Secondary | ICD-10-CM

## 2023-02-18 MED ORDER — TOLTERODINE TARTRATE ER 4 MG PO CP24: 4.0000 mg | ORAL_CAPSULE | Freq: Every day | ORAL | 11 refills | Status: DC

## 2023-02-18 NOTE — Patient Instructions (Signed)
I value your feedback and you entrusting us with your care. If you get a [facility] patient survey, I would appreciate you taking the time to let us know about your experience today. Thank you!  [facility]: [phone]      

## 2023-03-13 ENCOUNTER — Ambulatory Visit
Admission: RE | Admit: 2023-03-13 | Discharge: 2023-03-13 | Disposition: A | Discharge status: Home / Self Care | Payer: Managed Care, Other (non HMO) | Source: Ambulatory Visit | Attending: Obstetrics and Gynecology | Admitting: Obstetrics and Gynecology

## 2023-03-13 DIAGNOSIS — Z1231 Encounter for screening mammogram for malignant neoplasm of breast: Secondary | ICD-10-CM | POA: Diagnosis not present

## 2023-07-16 ENCOUNTER — Ambulatory Visit: Payer: Managed Care, Other (non HMO)

## 2023-07-16 DIAGNOSIS — K640 First degree hemorrhoids: Secondary | ICD-10-CM | POA: Diagnosis not present

## 2023-07-16 DIAGNOSIS — K635 Polyp of colon: Secondary | ICD-10-CM | POA: Diagnosis not present

## 2023-07-16 DIAGNOSIS — Z1211 Encounter for screening for malignant neoplasm of colon: Secondary | ICD-10-CM | POA: Diagnosis present

## 2024-03-30 ENCOUNTER — Other Ambulatory Visit: Payer: Self-pay | Admitting: Obstetrics and Gynecology

## 2024-03-30 DIAGNOSIS — N3946 Mixed incontinence: Secondary | ICD-10-CM

## 2024-08-10 NOTE — Progress Notes (Unsigned)
  Cardiology Office Note   Date:  08/12/2024  ID:  Brenda Guzman, DOB January 10, 1961, MRN [id number] PCP: Valora Agent, MD   HeartCare Providers Cardiologist:  Caron Poser, MD     History of Present Illness Brenda Guzman is a 63 y.o. female PMH HTN, HLD, SVT who presents for further evaluation management of palpitations.  Patient previously seen by [facility].  Last LDL 95 05/2024.  She notes that she has been dealing with palpitations for a very long time.  She says a prior monitor diagnosed her with SVT but she does not recall a specific rhythm.  She says the palpitations are progressive and are now interfering with her daily quality of life.  She sometimes has dizziness with these but no frank syncope.  She also notes that her mother late in life, was diagnosed with an atrial myxoma.  Relevant CVD History -TTE 06/2023 normal biventricular function, mild MR -TTE 10/2018 normal biventricular function, mild MR   ROS: Pt denies any chest discomfort, jaw pain, arm pain, palpitations, syncope, presyncope, orthopnea, PND, or LE edema.  Studies Reviewed I have independently reviewed the patient's ECG, recent blood work, previous cardiac testing, and previous medical records.  Physical Exam VS:  BP 106/76 (BP Location: Right Arm, Patient Position: Sitting, Cuff Size: Normal)   Pulse (!) 58   Ht 5' 2.5 (1.588 m)   Wt 136 lb (61.7 kg)   SpO2 99%   BMI 24.48 kg/m        Wt Readings from Last 3 Encounters:  08/12/24 136 lb (61.7 kg)  02/18/23 137 lb (62.1 kg)  03/22/21 133 lb (60.3 kg)    GEN: No acute distress. NECK: No JVD; No carotid bruits. CARDIAC: RRR, no murmurs, rubs, gallops. RESPIRATORY:  Clear to auscultation. EXTREMITIES:  Warm and well-perfused. No edema.  ASSESSMENT AND PLAN Paroxysmal tachycardia SVT Palpitations History of mitral valve prolapse Family history of atrial myxoma Patient presents with frequent palpitations that are quite symptomatic.  They  are sometimes accompanied by dizziness.  A prior monitor for which I do not have the records for showed PSVT.  She notes that prior echoes have shown mitral valve prolapse.  Her mother was also diagnosed with an atrial myxoma shortly before she passed away, unfortunately.  She is bradycardic, so unfortunately we do not have a lot of room for AV nodal blocking agents.  Plan: - Echocardiogram to evaluate for structural cause including mitral valve prolapse and atrial myxoma - 2-week ZIO monitor - Continue metoprolol succinate 25 mg daily for now; if she is still symptomatic following her testing, we can trial diltiazem instead.  She is bradycardic, so we unfortunately do not have a lot of room for uptitration - If the above workup is unrevealing, will consider referral to EP for loop recorder implantation        Dispo: RTC as needed after cardiac testing  Signed, Caron Poser, MD

## 2024-08-12 ENCOUNTER — Ambulatory Visit

## 2024-08-12 VITALS — BP 106/76 | HR 58 | Ht 62.5 in | Wt 136.0 lb

## 2024-08-12 DIAGNOSIS — R002 Palpitations: Secondary | ICD-10-CM

## 2024-08-12 DIAGNOSIS — I479 Paroxysmal tachycardia, unspecified: Secondary | ICD-10-CM

## 2024-08-12 DIAGNOSIS — I471 Supraventricular tachycardia, unspecified: Secondary | ICD-10-CM | POA: Diagnosis not present

## 2024-08-12 DIAGNOSIS — I341 Nonrheumatic mitral (valve) prolapse: Secondary | ICD-10-CM

## 2024-08-12 DIAGNOSIS — R42 Dizziness and giddiness: Secondary | ICD-10-CM

## 2024-08-12 NOTE — Patient Instructions (Signed)
 Medication Instructions:   Your physician recommends that you continue on your current medications as directed. Please refer to the Current Medication list given to you today.   *If you need a refill on your cardiac medications before your next appointment, please call your pharmacy*  Lab Work:  No labs ordered today  If you have labs (blood work) drawn today and your tests are completely normal, you will receive your results only by: MyChart Message (if you have MyChart) OR A paper copy in the mail If you have any lab test that is abnormal or we need to change your treatment, we will call you to review the results.  Testing/Procedures:  Your physician has requested that you have an echocardiogram. Echocardiography is a painless test that uses sound waves to create images of your heart. It provides your doctor with information about the size and shape of your heart and how well your heart's chambers and valves are working.   You may receive an ultrasound enhancing agent through an IV if needed to better visualize your heart during the echo. This procedure takes approximately one hour.  There are no restrictions for this procedure.  This will take place at [address] [facility], [place] [address]  Please note: We ask at that you not bring children with you during ultrasound (echo/ vascular) testing. Due to room size and safety concerns, children are not allowed in the ultrasound rooms during exams. Our front office staff cannot provide observation of children in our lobby area while testing is being conducted. An adult accompanying a patient to their appointment will only be allowed in the ultrasound room at the discretion of the ultrasound technician under special circumstances. We apologize for any inconvenience.    ZIO XT- Long Term Monitor Instructions  Your physician has requested you wear a ZIO patch monitor for 14 days.  This is a single patch monitor.  [employer] supplies one patch monitor per enrollment. Additional stickers are not available. Please do not apply patch if you will be having a Nuclear Stress Test, Echocardiogram, Cardiac CT, MRI, or Chest Xray during the period you would be wearing the monitor. The patch cannot be worn during these tests. You cannot remove and re-apply the ZIO XT patch monitor.  Your ZIO patch monitor will be mailed 3 day USPS to your address on file. It may take 3-5 days to receive your monitor after you have been enrolled. Once you have received your monitor, please review the enclosed instructions. Your monitor has already been registered assigning a specific monitor serial number to you.  Billing and Patient Assistance Program Information  We have supplied [employer] with any of your insurance information on file for billing purposes.  [employer] offers a sliding scale Patient Assistance Program for patients that do not have insurance, or whose insurance does not completely cover the cost of the ZIO monitor.  You must apply for the Patient Assistance Program to qualify for this discounted rate.  To apply, please call [employer] at [phone], select option 4, select option 2, ask to apply for Patient Assistance Program. M[place]eth will ask your household income, and how many people are in your household. They will quote your out-of-pocket cost based on that information. [employer] will also be able to set up a 34-month, interest-free payment plan if needed.  Applying the monitor  Hold abrader disc by orange tab. Rub abrader in 40 strokes over the upper left chest as indicated in your monitor instructions.  Clean area with 4 enclosed alcohol pads. Let dry.  Apply patch as indicated in monitor instructions. Patch will be placed under collarbone on left side of chest with arrow pointing upward.  Rub patch adhesive wings for 2 minutes. Remove white label marked 1. Remove the white label marked 2. Rub patch adhesive wings for 2  additional minutes.  While looking in a mirror, press and release button in center of patch. A small green light will flash 3-4 times. This will be your only indicator that the monitor has been turned on.   AFTER APPLYING: Do not shower for the first 24 hours. You may shower after the first 24 hours.  Press the button if you feel a symptom. You will hear a small click. Record Date, Time and Symptom in the Patient Logbook.   AFTER 14 DAYS OF MONITORING: When you are ready to remove the patch, follow instructions on the last 2 pages of Patient Logbook.  Stick patch monitor into the tabs at the bottom of the return box.  Place Patient Logbook in the blue and white box. Use locking tab on box and tape box closed securely. The blue and white box has prepaid postage on it. Please place it in the mailbox as soon as possible. Your physician should have your test results approximately 7-14 days after the monitor has been mailed back to [facility].   TROUBLESHOOTING: Call [facility] at [phone] if you have questions regarding your ZIO XT patch monitor.  Call them immediately if you see an orange light blinking on your monitor.  If your monitor falls off in less than 4 days, contact our Monitor department at [phone].  If your monitor becomes loose or falls off after 4 days call [employer] at [phone] for suggestions on securing your monitor.   Follow-Up: At [facility], you and your health needs are our priority.  As part of our continuing mission to provide you with exceptional heart care, our providers are all part of one team.  This team includes your primary Cardiologist (physician) and Advanced Practice Providers or APPs (Physician Assistants and Nurse Practitioners) who all work together to provide you with the care you need, when you need it.  Your next appointment:   As Needed   Provider:    You may see Caron Poser MD or one of the following  Advanced Practice Providers on your designated Care Team:      We recommend signing up for the patient portal called MyChart.  Sign up information is provided on this After Visit Summary.  MyChart is used to connect with patients for Virtual Visits (Telemedicine).  Patients are able to view lab/test results, encounter notes, upcoming appointments, etc.  Non-urgent messages can be sent to your provider as well.   To learn more about what you can do with MyChart, go to [email or web address].

## 2024-09-06 ENCOUNTER — Ambulatory Visit: Payer: Self-pay

## 2024-09-06 DIAGNOSIS — R002 Palpitations: Secondary | ICD-10-CM | POA: Diagnosis not present

## 2024-09-13 NOTE — Progress Notes (Unsigned)
 PCP: Valora Lynwood FALCON, MD   No chief complaint on file.   HPI:      Ms. Brenda Guzman is a 63 y.o. No obstetric history on file. who LMP was No LMP recorded. Patient is postmenopausal., presents today for her annual examination.  Her menses are absent due to menopause. She does not have PMB. She does not have vasomotor sx.   Sex activity: not sexually active currently, no vag sx.  Last Pap: 03/22/21 Results were: no abnormalities /neg HPV DNA.  Hx of STDs: none  Last mammogram: 03/13/23 Results were: normal--routine follow-up in 12 months There is a FH of breast cancer in her mat aunt and pat cousin, genetic testing not indicated. There is no FH of ovarian cancer. The patient does  self-breast exams.  Colonoscopy: colonoscopy ~2017 without abnormalities.  Repeat due after 5 years due to FH, not done due to covid. Had scheduled at [facility] 2022 but canceled, never rescheduled.   Tobacco use: The patient denies current or previous tobacco use. Alcohol use: none  No drug use Exercise: moderately active  She does get adequate calcium but not Vitamin D in her diet.  Pt with hx of enlarged thyroid  with normal thyroid  labs the past few yrs and stable u/s (2020 and 2021), followed by PCP.  Labs with PCP.   Has issues with SUI and urge incontinence. Tried kegels without relief. Denies any caffeine use. Has nocturia especially if drinks before bed; hurts in AM when has first void. Has frequency and urgency; not drinking as much water due to incont sx. Interested in trying meds.    Past Medical History:  Diagnosis Date   Abnormal uterine bleeding    DVT (deep venous thrombosis) (HCC)    Enlarged thyroid     Hypercholesterolemia    Hypertension    Irregular heart beat    Panic disorder    Urge incontinence     Past Surgical History:  Procedure Laterality Date   CESAREAN SECTION     EYE SURGERY     LEG SURGERY      Family History  Problem Relation Age of Onset   Breast cancer  Maternal Aunt 71   Breast cancer Cousin 40   Alzheimer's disease Father    Hypertension Father    Bladder Cancer Maternal Grandmother        24s   Colon cancer Paternal Grandmother        48s   Transient ischemic attack Paternal Grandfather    Heart failure Mother        [place]    Social History   Socioeconomic History   Marital status: Married    Spouse name: Not on file   Number of children: Not on file   Years of education: Not on file   Highest education level: Not on file  Occupational History   Not on file  Tobacco Use   Smoking status: Never   Smokeless tobacco: Never  Vaping Use   Vaping status: Never Used  Substance and Sexual Activity   Alcohol use: Yes    Comment: occ   Drug use: No   Sexual activity: Not Currently    Birth control/protection: Post-menopausal  Other Topics Concern   Not on file  Social History Narrative   Not on file   Social Drivers of Health   Financial Resource Strain: Low Risk  (06/23/2024)   Received from [facility]   Overall Financial Resource Strain (CARDIA)  Difficulty of Paying Living Expenses: Not hard at all  Food Insecurity: No Food Insecurity (06/23/2024)   [facility]   Hunger Vital Sign    Within the past 12 months, you worried that your food would run out before you got the money to buy more.: Never true    Within the past 12 months, the food you bought just didn't last and you didn't have money to get more.: Never true  Transportation Needs: No Transportation Needs (06/23/2024)   Received from [facility] - Transportation    In the past 12 months, has lack of transportation kept you from medical appointments or from getting medications?: No    Lack of Transportation (Non-Medical): No  Physical Activity: Not on file  Stress: Not on file  Social Connections: Unknown (04/08/2022)   Received from [facility]   Social Network    Social  Network: Not on file  Intimate Partner Violence: Unknown (02/26/2022)   Received from [facility]   HITS    Physically Hurt: Not on file    Insult or Talk Down To: Not on file    Threaten Physical Harm: Not on file    Scream or Curse: Not on file    No outpatient medications have been marked as taking for the 09/14/24 encounter (Appointment) with Sevag Shearn B, PA-C.      ROS:  Review of Systems  Constitutional:  Negative for fatigue, fever and unexpected weight change.  Respiratory:  Negative for cough, shortness of breath and wheezing.   Cardiovascular:  Negative for chest pain, palpitations and leg swelling.  Gastrointestinal:  Negative for blood in stool, constipation, diarrhea, nausea and vomiting.  Endocrine: Negative for cold intolerance, heat intolerance and polyuria.  Genitourinary:  Positive for frequency. Negative for dyspareunia, dysuria, flank pain, genital sores, hematuria, menstrual problem, pelvic pain, urgency, vaginal bleeding, vaginal discharge and vaginal pain.  Musculoskeletal:  Negative for back pain, joint swelling and myalgias.  Skin:  Negative for rash.  Neurological:  Negative for dizziness, syncope, light-headedness, numbness and headaches.  Hematological:  Negative for adenopathy.  Psychiatric/Behavioral:  Negative for agitation, confusion, sleep disturbance and suicidal ideas. The patient is not nervous/anxious.      Objective: There were no vitals taken for this visit.   Physical Exam Constitutional:      Appearance: She is well-developed.  Genitourinary:     Vulva normal.     Right Labia: No rash, tenderness or lesions.    Left Labia: No tenderness, lesions or rash.    No vaginal discharge, erythema or tenderness.      Right Adnexa: not tender and no mass present.    Left Adnexa: not tender and no mass present.    No cervical motion tenderness, friability or polyp.     Uterus is not enlarged or tender.  Breasts:    Right: No mass,  nipple discharge, skin change or tenderness.     Left: No mass, nipple discharge, skin change or tenderness.  Neck:     Thyroid : Thyromegaly present.  Cardiovascular:     Rate and Rhythm: Normal rate and regular rhythm.     Heart sounds: Normal heart sounds. No murmur heard. Pulmonary:     Effort: Pulmonary effort is normal.     Breath sounds: Normal breath sounds.  Abdominal:     Palpations: Abdomen is soft.     Tenderness: There is no abdominal tenderness. There is no guarding or  rebound.  Musculoskeletal:        General: Normal range of motion.     Cervical back: Normal range of motion.  Lymphadenopathy:     Cervical: No cervical adenopathy.  Neurological:     General: No focal deficit present.     Mental Status: She is alert and oriented to person, place, and time.     Cranial Nerves: No cranial nerve deficit.  Skin:    General: Skin is warm and dry.  Psychiatric:        Mood and Affect: Mood normal.        Behavior: Behavior normal.        Thought Content: Thought content normal.        Judgment: Judgment normal.  Vitals reviewed.     Assessment/Plan: Encounter for annual routine gynecological examination  Encounter for screening mammogram for malignant neoplasm of breast - Plan: MM 3D SCREENING MAMMOGRAM BILATERAL BREAST; pt to schedule mammo  Screening for colon cancer - Plan: Ambulatory referral to Gastroenterology; refer back to [facility]  Mixed incontinence urge and stress - Plan: tolterodine  (DETROL  LA) 4 MG 24 hr capsule; try detrol , f/u in 3 months re: sx. Void Q2 yrs, cont kegels.    No orders of the defined types were placed in this encounter.   GYN counsel mammography screening, adequate intake of calcium and vitamin D    F/U  No follow-ups on file.  Nuno Brubacher B. Brandi Tomlinson, PA-C 09/13/2024 11:24 AM

## 2024-09-14 ENCOUNTER — Encounter: Payer: Self-pay | Admitting: Obstetrics and Gynecology

## 2024-09-14 ENCOUNTER — Ambulatory Visit (INDEPENDENT_AMBULATORY_CARE_PROVIDER_SITE_OTHER): Admitting: Obstetrics and Gynecology

## 2024-09-14 ENCOUNTER — Other Ambulatory Visit (HOSPITAL_COMMUNITY)
Admission: RE | Admit: 2024-09-14 | Discharge: 2024-09-14 | Disposition: A | Discharge status: Home / Self Care | Source: Ambulatory Visit | Attending: Obstetrics and Gynecology | Admitting: Obstetrics and Gynecology

## 2024-09-14 VITALS — BP 110/73 | HR 53 | Ht 62.5 in | Wt 137.0 lb

## 2024-09-14 DIAGNOSIS — Z1231 Encounter for screening mammogram for malignant neoplasm of breast: Secondary | ICD-10-CM

## 2024-09-14 DIAGNOSIS — Z01411 Encounter for gynecological examination (general) (routine) with abnormal findings: Secondary | ICD-10-CM | POA: Diagnosis not present

## 2024-09-14 DIAGNOSIS — Z01419 Encounter for gynecological examination (general) (routine) without abnormal findings: Secondary | ICD-10-CM

## 2024-09-14 DIAGNOSIS — R102 Pelvic and perineal pain unspecified side: Secondary | ICD-10-CM | POA: Diagnosis not present

## 2024-09-14 DIAGNOSIS — Z1151 Encounter for screening for human papillomavirus (HPV): Secondary | ICD-10-CM | POA: Diagnosis present

## 2024-09-14 DIAGNOSIS — Z1211 Encounter for screening for malignant neoplasm of colon: Secondary | ICD-10-CM

## 2024-09-14 DIAGNOSIS — N3946 Mixed incontinence: Secondary | ICD-10-CM | POA: Diagnosis not present

## 2024-09-14 DIAGNOSIS — Z124 Encounter for screening for malignant neoplasm of cervix: Secondary | ICD-10-CM | POA: Insufficient documentation

## 2024-09-14 NOTE — Patient Instructions (Addendum)
 I value your feedback and you entrusting [place] with your care. If you get a [facility] patient survey, I would appreciate you taking the time to let us know about your experience today. Thank you!  [facility] ([place])--[phone]

## 2024-09-16 LAB — CYTOLOGY - PAP
Comment: NEGATIVE
Diagnosis: NEGATIVE
High risk HPV: NEGATIVE

## 2024-09-27 ENCOUNTER — Ambulatory Visit

## 2024-09-27 DIAGNOSIS — R102 Pelvic and perineal pain unspecified side: Secondary | ICD-10-CM | POA: Diagnosis not present

## 2024-09-28 ENCOUNTER — Telehealth: Payer: Self-pay | Admitting: Obstetrics and Gynecology

## 2024-09-28 NOTE — Telephone Encounter (Signed)
 Pt aware of neg GYN u/s results. Has suprapubic sx, most likely bladder related. Pt to f/u with urology at upcoming appt.

## 2024-09-30 ENCOUNTER — Other Ambulatory Visit: Payer: Self-pay

## 2024-09-30 ENCOUNTER — Ambulatory Visit

## 2024-09-30 ENCOUNTER — Ambulatory Visit: Payer: Self-pay

## 2024-09-30 DIAGNOSIS — R002 Palpitations: Secondary | ICD-10-CM

## 2024-09-30 DIAGNOSIS — I471 Supraventricular tachycardia, unspecified: Secondary | ICD-10-CM

## 2024-09-30 DIAGNOSIS — R42 Dizziness and giddiness: Secondary | ICD-10-CM

## 2024-09-30 DIAGNOSIS — I479 Paroxysmal tachycardia, unspecified: Secondary | ICD-10-CM

## 2024-09-30 DIAGNOSIS — I341 Nonrheumatic mitral (valve) prolapse: Secondary | ICD-10-CM

## 2024-09-30 LAB — ECHOCARDIOGRAM COMPLETE
AR max vel: 1.91 cm2
AV Area VTI: 1.82 cm2
AV Area mean vel: 1.76 cm2
AV Mean grad: 4 mmHg
AV Peak grad: 7 mmHg
Ao pk vel: 1.32 m/s
Area-P 1/2: 3.99 cm2
S' Lateral: 2.67 cm

## 2024-10-01 ENCOUNTER — Other Ambulatory Visit: Payer: Self-pay | Admitting: *Deleted

## 2024-10-01 MED ORDER — DILTIAZEM HCL ER COATED BEADS 120 MG PO CP24
120.0000 mg | ORAL_CAPSULE | Freq: Every day | ORAL | 3 refills | Status: DC
Start: 2024-10-01 — End: 2024-10-20

## 2024-10-01 NOTE — Progress Notes (Signed)
 Called and spoke with pt; verified pt identity with 2 identifiers; informed patient of the following results of her Echocardiogram per Dr. Argentina: Echocardiogram revealed normal cardiac function with no evidence of atrial myxoma or mitral valve prolapse, as discussed during the appointment.  I also discussed with pt, Dr. Martine recommendation:  the patient's medication for SVT is being changed from Metoprolol (Toprol XL) 25 mg daily, which has been reported as ineffective, to Diltiazem ER 120 mg daily.  The patient confirmed that she took her Metoprolol 25 mg dose this morning. She was instructed that today will be her last dose of Metoprolol. A 24-hour washout period is required prior to starting Diltiazem. The patient was advised not to take any additional Metoprolol and to begin Diltiazem on Sunday, 11/9.  Pt verbalized understanding and thanked me for the follow up phone call.

## 2024-10-20 ENCOUNTER — Emergency Department: Admission: EM | Admit: 2024-10-20 | Discharge: 2024-10-20 | Disposition: A | Discharge status: Home / Self Care

## 2024-10-20 ENCOUNTER — Other Ambulatory Visit: Payer: Self-pay | Admitting: *Deleted

## 2024-10-20 ENCOUNTER — Encounter: Payer: Self-pay | Admitting: Intensive Care

## 2024-10-20 ENCOUNTER — Other Ambulatory Visit: Payer: Self-pay

## 2024-10-20 DIAGNOSIS — R0602 Shortness of breath: Secondary | ICD-10-CM | POA: Insufficient documentation

## 2024-10-20 DIAGNOSIS — I479 Paroxysmal tachycardia, unspecified: Secondary | ICD-10-CM

## 2024-10-20 DIAGNOSIS — R002 Palpitations: Secondary | ICD-10-CM

## 2024-10-20 DIAGNOSIS — T461X5A Adverse effect of calcium-channel blockers, initial encounter: Secondary | ICD-10-CM | POA: Diagnosis not present

## 2024-10-20 DIAGNOSIS — T7840XA Allergy, unspecified, initial encounter: Secondary | ICD-10-CM

## 2024-10-20 MED ORDER — FAMOTIDINE 20 MG PO TABS
20.0000 mg | ORAL_TABLET | Freq: Once | ORAL | Status: AC
  Administered 2024-10-20: 20 mg via ORAL
  Filled 2024-10-20: qty 1

## 2024-10-20 MED ORDER — PREDNISONE 20 MG PO TABS: 60.0000 mg | ORAL_TABLET | Freq: Every day | ORAL | 0 refills | Status: AC

## 2024-10-20 MED ORDER — DIPHENHYDRAMINE HCL 25 MG PO CAPS
25.0000 mg | ORAL_CAPSULE | Freq: Once | ORAL | Status: AC
  Administered 2024-10-20: 25 mg via ORAL
  Filled 2024-10-20: qty 1

## 2024-10-20 MED ORDER — PREDNISONE 20 MG PO TABS
60.0000 mg | ORAL_TABLET | Freq: Once | ORAL | Status: AC
  Administered 2024-10-20: 60 mg via ORAL
  Filled 2024-10-20: qty 3

## 2024-10-20 MED ORDER — EPINEPHRINE 0.3 MG/0.3ML IJ SOAJ
0.3000 mg | Freq: Once | INTRAMUSCULAR | Status: AC
  Administered 2024-10-20: 0.3 mg via INTRAMUSCULAR
  Filled 2024-10-20: qty 0.3

## 2024-10-20 MED ORDER — PROPRANOLOL HCL 10 MG PO TABS: 10.0000 mg | ORAL_TABLET | Freq: Three times a day (TID) | ORAL | Status: AC | PRN

## 2024-10-20 NOTE — ED Provider Notes (Signed)
 [facility] Provider Note    Event Date/Time   First MD Initiated Contact with Patient 10/20/24 2027     (approximate)   History   Allergic Reaction   HPI  Brenda Guzman is a 63 y.o. female who presents today with concern of an allergic reaction.  She has a history of palpitations, she had been on metoprolol for a long time but was not helping with her symptoms, her cardiologist recently switched her to diltiazem .  She states after taking the diltiazem  she started having some increased symptoms of shortness of breath, on Monday started developing a rash, she called her cardiologist and was told to stop taking the diltiazem .  Since then the rash has continued to present and now she has spread to her face and now she is having some lip swelling as well.  Denies any chest pain abdominal pain nausea vomiting throat swelling or shortness of breath.  No other complaints at this time.     Physical Exam   Triage Vital Signs: ED Triage Vitals  Encounter Vitals Group     BP 10/20/24 1747 (!) 156/92     Girls Systolic BP Percentile --      Girls Diastolic BP Percentile --      Boys Systolic BP Percentile --      Boys Diastolic BP Percentile --      Pulse Rate 10/20/24 1747 74     Resp 10/20/24 1747 18     Temp 10/20/24 1747 98.5 F (36.9 C)     Temp Source 10/20/24 1747 Oral     SpO2 10/20/24 1747 99 %     Weight 10/20/24 1750 135 lb (61.2 kg)     Height 10/20/24 1750 5' 3 (1.6 m)     Head Circumference --      Peak Flow --      Pain Score 10/20/24 1750 5     Pain Loc --      Pain Education --      Exclude from Growth Chart --     Most recent vital signs: Vitals:   10/20/24 1751 10/20/24 2030  BP: (!) 156/92 (!) 148/74  Pulse: 75 68  Resp:  18  Temp: 98.5 F (36.9 C)   SpO2:  100%     General: Awake, no distress.  CV:  Good peripheral perfusion.  Resp:  Normal effort.  Abd:  No distention. Derm:  Urticarial rash appreciated across the  bilateral cheeks and the anterior chest wall, there is mild swelling of the upper lip Other:     ED Results / Procedures / Treatments   Labs (all labs ordered are listed, but only abnormal results are displayed) Labs Reviewed - No data to display   EKG     RADIOLOGY   PROCEDURES:  Critical Care performed: No  Procedures   MEDICATIONS ORDERED IN ED: Medications  EPINEPHrine  (EPI-PEN) injection 0.3 mg (0.3 mg Intramuscular Given 10/20/24 2130)  diphenhydrAMINE  (BENADRYL ) capsule 25 mg (25 mg Oral Given 10/20/24 2128)  famotidine  (PEPCID ) tablet 20 mg (20 mg Oral Given 10/20/24 2128)  predniSONE  (DELTASONE ) tablet 60 mg (60 mg Oral Given 10/20/24 2129)     IMPRESSION / MDM / ASSESSMENT AND PLAN / ED COURSE  I reviewed the triage vital signs and the nursing notes.  Patient's presentation is most consistent with acute presentation with potential threat to life or bodily function.  63 year old female who presents today with concern of an allergic reaction.  Given the presentation I am concerned that this may be consistent with anaphylaxis given her symptoms of shortness of breath as well as the presence of a rash that continues to spread at this time.  I am giving her a dose of steroids Benadryl  and Pepcid .  We also discussed possible epinephrine  patient was amenable to trial of this.  Will give her a dose now and continue to monitor her here briefly.   Clinical Course as of 10/20/24 2311  Wed Oct 20, 2024  2204 Patient's rash appears to have improved, we will continue to monitor her here briefly and anticipate likely discharge home. [SK]  2308 Patient with no complaints at this time, rash is still faintly present but improved from initial.  May have been a delayed type reaction, will give her a course of steroids for home instructed on use of Benadryl  and Pepcid .  She verbalized understanding is aware of return precautions. [SK]    Clinical  Course User Index [SK] Fernand Rossie HERO, MD     FINAL CLINICAL IMPRESSION(S) / ED DIAGNOSES   Final diagnoses:  Allergic reaction to drug, initial encounter     Rx / DC Orders   ED Discharge Orders          Ordered    predniSONE  (DELTASONE ) 20 MG tablet  Daily with breakfast        10/20/24 2310             Note:  This document was prepared using Dragon voice recognition software and may include unintentional dictation errors.   Fernand Rossie HERO, MD 10/20/24 [phone]

## 2024-10-20 NOTE — ED Triage Notes (Signed)
 Reports swollen lips, rash on chest, and rash on face that started Monday. Started diltiazem  on 10/04/24 and told by cardiologist told patient to stop the new med.   Denies cp or sob. C/o headache

## 2024-10-20 NOTE — Discharge Instructions (Signed)
 You were seen today due to concern of an allergic reaction, at this time I have written for a brief course of steroids for you to take at home, please take these as instructed.  You may also take over-the-counter Pepcid  20 mg twice a day, as well as over-the-counter Benadryl  25 mg or 1 tablet daily while your symptoms persist.  If you have any worsening symptoms such as chest pain, shortness of breath, lightheadedness, or or any other symptoms you find concerning please return to the emergency department immediately for further medical management.

## 2024-10-25 ENCOUNTER — Other Ambulatory Visit: Payer: Self-pay

## 2024-10-25 ENCOUNTER — Encounter (HOSPITAL_COMMUNITY): Payer: Self-pay

## 2024-10-25 ENCOUNTER — Emergency Department (HOSPITAL_COMMUNITY)
Admission: EM | Admit: 2024-10-25 | Discharge: 2024-10-25 | Disposition: A | Discharge status: Home / Self Care | Attending: Emergency Medicine | Admitting: Emergency Medicine

## 2024-10-25 DIAGNOSIS — L509 Urticaria, unspecified: Secondary | ICD-10-CM | POA: Diagnosis present

## 2024-10-25 LAB — COMPREHENSIVE METABOLIC PANEL WITH GFR
ALT: 25 U/L (ref 0–44)
AST: 24 U/L (ref 15–41)
Albumin: 4 g/dL (ref 3.5–5.0)
Alkaline Phosphatase: 117 U/L (ref 38–126)
Anion gap: 11 (ref 5–15)
BUN: 19 mg/dL (ref 8–23)
CO2: 27 mmol/L (ref 22–32)
Calcium: 9.1 mg/dL (ref 8.9–10.3)
Chloride: 102 mmol/L (ref 98–111)
Creatinine, Ser: 0.96 mg/dL (ref 0.44–1.00)
GFR, Estimated: >60 mL/min (ref >60)
Glucose, Bld: 95 mg/dL (ref 70–99)
Potassium: 3.7 mmol/L (ref 3.5–5.1)
Sodium: 139 mmol/L (ref 135–145)
Total Bilirubin: 0.4 mg/dL (ref 0.0–1.2)
Total Protein: 7 g/dL (ref 6.5–8.1)

## 2024-10-25 LAB — CBC WITH DIFFERENTIAL/PLATELET
Abs Immature Granulocytes: 0.05 K/uL (ref 0.00–0.07)
Basophils Absolute: 0 K/uL (ref 0.0–0.1)
Basophils Relative: 0 %
Eosinophils Absolute: 0 K/uL (ref 0.0–0.5)
Eosinophils Relative: 0 %
HCT: 42 % (ref 36.0–46.0)
Hemoglobin: 13.7 g/dL (ref 12.0–15.0)
Immature Granulocytes: 0 %
Lymphocytes Relative: 16 %
Lymphs Abs: 2.2 K/uL (ref 0.7–4.0)
MCH: 30.3 pg (ref 26.0–34.0)
MCHC: 32.6 g/dL (ref 30.0–36.0)
MCV: 92.9 fL (ref 80.0–100.0)
Monocytes Absolute: 1 K/uL (ref 0.1–1.0)
Monocytes Relative: 8 %
Neutro Abs: 10.2 K/uL — ABNORMAL HIGH (ref 1.7–7.7)
Neutrophils Relative %: 76 %
Platelets: 277 K/uL (ref 150–400)
RBC: 4.52 MIL/uL (ref 3.87–5.11)
RDW: 13.1 % (ref 11.5–15.5)
WBC: 13.5 K/uL — ABNORMAL HIGH (ref 4.0–10.5)
nRBC: 0 % (ref 0.0–0.2)

## 2024-10-25 LAB — LIPASE, BLOOD: Lipase: 53 U/L — ABNORMAL HIGH (ref 11–51)

## 2024-10-25 MED ORDER — DEXAMETHASONE SOD PHOSPHATE PF 10 MG/ML IJ SOLN
10.0000 mg | Freq: Once | INTRAMUSCULAR | Status: AC
  Administered 2024-10-25: 10 mg via INTRAVENOUS

## 2024-10-25 MED ORDER — PREDNISONE 10 MG (21) PO TBPK: ORAL_TABLET | Freq: Every day | ORAL | 0 refills | Status: AC

## 2024-10-25 MED ORDER — LACTATED RINGERS IV BOLUS
1000.0000 mL | Freq: Once | INTRAVENOUS | Status: AC
  Administered 2024-10-25: 1000 mL via INTRAVENOUS

## 2024-10-25 MED ORDER — DIPHENHYDRAMINE HCL 50 MG/ML IJ SOLN
50.0000 mg | Freq: Once | INTRAMUSCULAR | Status: AC
  Administered 2024-10-25: 50 mg via INTRAVENOUS
  Filled 2024-10-25: qty 1

## 2024-10-25 NOTE — ED Provider Notes (Signed)
 [place] EMERGENCY DEPARTMENT AT [facility] Provider Note   CSN: [id number] Arrival date & time: 10/25/24  9670     History Chief Complaint  Patient presents with   Urticaria    HPI Brenda Guzman is a 63 y.o. female presenting for chief complaint of rash. Started dilt for 2 weeks. Got diffuse erythema.  Seen last week. Diagnosed as an allergic reaction. On and off benadryl .   Patient's recorded medical, surgical, social, medication list and allergies were reviewed in the Snapshot window as part of the initial history.   Review of Systems   Review of Systems  Constitutional:  Positive for fatigue. Negative for chills and fever.  HENT:  Negative for ear pain and sore throat.   Eyes:  Negative for pain and visual disturbance.  Respiratory:  Negative for cough and shortness of breath.   Cardiovascular:  Negative for chest pain and palpitations.  Gastrointestinal:  Negative for abdominal pain and vomiting.  Genitourinary:  Negative for dysuria and hematuria.  Musculoskeletal:  Negative for arthralgias and back pain.  Skin:  Positive for rash. Negative for color change.  Neurological:  Negative for seizures and syncope.  All other systems reviewed and are negative.   Physical Exam Updated Vital Signs BP (!) 166/98 (BP Location: Left Arm)   Pulse 60   Temp (!) 97.5 F (36.4 C) (Oral)   Resp 18   SpO2 100%  Physical Exam Constitutional:      General: She is not in acute distress.    Appearance: She is not ill-appearing or toxic-appearing.  HENT:     Head: Normocephalic and atraumatic.  Eyes:     Extraocular Movements: Extraocular movements intact.     Pupils: Pupils are equal, round, and reactive to light.  Cardiovascular:     Rate and Rhythm: Normal rate.  Pulmonary:     Effort: No respiratory distress.  Abdominal:     General: Abdomen is flat.  Musculoskeletal:        General: No swelling, deformity or signs of injury.     Cervical back: Normal  range of motion. No rigidity.  Skin:    General: Skin is warm and dry.     Findings: Rash present.  Neurological:     General: No focal deficit present.     Mental Status: She is alert and oriented to person, place, and time.  Psychiatric:        Mood and Affect: Mood normal.      ED Course/ Medical Decision Making/ A&P    Procedures Procedures   Medications Ordered in ED Medications  lactated ringers bolus 1,000 mL (1,000 mLs Intravenous New Bag/Given 10/25/24 0403)  dexamethasone (DECADRON) injection 10 mg (10 mg Intravenous Given 10/25/24 0402)  diphenhydrAMINE  (BENADRYL ) injection 50 mg (50 mg Intravenous Given 10/25/24 0402)    Medical Decision Making:   63 year old female presenting with diffuse urticaria.  Started after taking diltiazem  for 2 weeks, gradually built up.  She stopped the medication a week ago and rash has been persistent.  No longer worsening.  States she stopped taking the prednisone  because the prescription expired but she had her brother refilled her prescription recently. Additionally she states she stopped taking the antihistamine because it was making her tired and tried switching to Zyrtec.  Denies sun exposure.  Overall well-appearing no acute distress.  History of present illness and physical exam findings are most consistent with drug eruption versus delayed hypersensitivity. Lab work performed shows no eosinophilia or  other acute pathology.  Mild leukocytosis more likely to be secondary to steroid utilization. Since patient has been on protracted steroids, ordered a steroid taper back off.  Discussed supportive care in the interim.  She stated she was following up with her dermatologist this week.  Alternative diagnoses such as scleroderma would be in the differential though considered less likely at this time.  Clinical Impression:  1. Urticaria      Discharge   Final Clinical Impression(s) / ED Diagnoses Final diagnoses:  Urticaria    Rx / DC  Orders ED Discharge Orders          Ordered    predniSONE  (STERAPRED UNI-PAK 21 TAB) 10 MG (21) TBPK tablet  Daily        10/25/24 0529              Jerral Meth, MD 10/25/24 [phone]

## 2024-10-25 NOTE — ED Triage Notes (Signed)
 Pt has hives all over x 1 week. Pt was seen and medicated with no relief.
